# Patient Record
Sex: Male | Born: 1995 | Race: Black or African American | Hispanic: No | Marital: Single | Smoking: Current some day smoker
Health system: Southern US, Community
[De-identification: ages and names within clinical notes are randomized; demographics above are authoritative.]

## PROBLEM LIST (undated history)

## (undated) ENCOUNTER — Emergency Department (HOSPITAL_COMMUNITY): Admission: EM | Payer: BLUE CROSS/BLUE SHIELD | Source: Home / Self Care

## (undated) DIAGNOSIS — F1010 Alcohol abuse, uncomplicated: Secondary | ICD-10-CM

## (undated) DIAGNOSIS — K859 Acute pancreatitis without necrosis or infection, unspecified: Secondary | ICD-10-CM

---

## 2017-03-25 ENCOUNTER — Emergency Department (HOSPITAL_COMMUNITY)
Admission: EM | Admit: 2017-03-25 | Discharge: 2017-03-25 | Disposition: A | Discharge status: Home / Self Care | Payer: Self-pay | Attending: Emergency Medicine | Admitting: Emergency Medicine

## 2017-03-25 ENCOUNTER — Emergency Department (HOSPITAL_COMMUNITY): Payer: Self-pay

## 2017-03-25 ENCOUNTER — Encounter (HOSPITAL_COMMUNITY): Payer: Self-pay

## 2017-03-25 DIAGNOSIS — S93401A Sprain of unspecified ligament of right ankle, initial encounter: Secondary | ICD-10-CM | POA: Insufficient documentation

## 2017-03-25 DIAGNOSIS — Y998 Other external cause status: Secondary | ICD-10-CM | POA: Insufficient documentation

## 2017-03-25 DIAGNOSIS — Y9367 Activity, basketball: Secondary | ICD-10-CM | POA: Insufficient documentation

## 2017-03-25 DIAGNOSIS — F17200 Nicotine dependence, unspecified, uncomplicated: Secondary | ICD-10-CM | POA: Insufficient documentation

## 2017-03-25 DIAGNOSIS — Y929 Unspecified place or not applicable: Secondary | ICD-10-CM | POA: Insufficient documentation

## 2017-03-25 DIAGNOSIS — X501XXA Overexertion from prolonged static or awkward postures, initial encounter: Secondary | ICD-10-CM | POA: Insufficient documentation

## 2017-03-25 MED ORDER — IBUPROFEN 200 MG PO TABS
600.0000 mg | ORAL_TABLET | Freq: Once | ORAL | Status: AC
  Administered 2017-03-25: 600 mg via ORAL
  Filled 2017-03-25: qty 3

## 2017-03-25 NOTE — ED Provider Notes (Signed)
[facility]-EMERGENCY DEPT Provider Note   CSN: [id number] Arrival date & time: 03/25/17  0158     History   Chief Complaint Chief Complaint  Patient presents with  . Ankle Injury    HPI Steven Mckenzie is a 21 y.o. male.  HPI  21 year old male presents with right ankle pain. On 3/28 at around 2 PM he everted his ankle while playing basketball. The patient has not been able to bear weight since. He states it is too painful. He's been having bruising and swelling. Has taken ibuprofen. Denies any knee or calf pain. No weakness or numbness. No other injuries.  History reviewed. No pertinent past medical history.  There are no active problems to display for this patient.   History reviewed. No pertinent surgical history.     Home Medications    Prior to Admission medications   Not on File    Family History No family history on file.  Social History Social History  Substance Use Topics  . Smoking status: Current Some Day Smoker  . Smokeless tobacco: Never Used  . Alcohol use Yes     Comment: daily      Allergies   Banana; Celery oil; and Pineapple   Review of Systems Review of Systems  Musculoskeletal: Positive for arthralgias and joint swelling.  Skin: Positive for color change. Negative for wound.  Neurological: Negative for weakness and numbness.  All other systems reviewed and are negative.    Physical Exam Updated Vital Signs BP 131/83 (BP Location: Left Arm)   Pulse (!) 104   Temp 98.8 F (37.1 C) (Oral)   Resp 20   Ht  (1.803 m)   Wt 160 lb (72.6 kg)   SpO2 96%   BMI 22.32 kg/m   Physical Exam  Constitutional: He is oriented to person, place, and time. He appears well-developed and well-nourished. No distress.  HENT:  Head: Normocephalic and atraumatic.  Right Ear: External ear normal.  Left Ear: External ear normal.  Nose: Nose normal.  Eyes: Right eye exhibits no discharge. Left eye exhibits no discharge.  Neck: Neck supple.    Cardiovascular: Normal rate and regular rhythm.   Pulses:      Dorsalis pedis pulses are 2+ on the right side, and 2+ on the left side.  Pulmonary/Chest: Effort normal and breath sounds normal.  Abdominal: Soft. There is no tenderness.  Musculoskeletal: He exhibits no edema.       Right knee: No tenderness found.       Right ankle: He exhibits decreased range of motion, swelling and ecchymosis. He exhibits no deformity, no laceration and normal pulse. Tenderness. No proximal fibula tenderness found. Achilles tendon exhibits no pain and no defect.       Right lower leg: He exhibits no tenderness (specifically no fibular head tenderness).       Right foot: There is swelling (proximal). There is no tenderness.  Negative squeeze test Ecchymosis and swelling to ankle, distal lower leg, and proximal foot. Diffuse mild tenderness  Neurological: He is alert and oriented to person, place, and time.  Skin: Skin is warm and dry. He is not diaphoretic.  Nursing note and vitals reviewed.    ED Treatments / Results  Labs (all labs ordered are listed, but only abnormal results are displayed) Labs Reviewed - No data to display  EKG  EKG Interpretation None       Radiology Dg Ankle Complete Right  Result Date: 03/25/2017 CLINICAL DATA:  No evidence  of fracture or dislocation. Bruising and lateral swelling. Initial encounter. EXAM: RIGHT ANKLE - COMPLETE 3+ VIEW COMPARISON:  None. FINDINGS: There is no evidence of fracture or dislocation. The ankle mortise is intact; the interosseous space is within normal limits. No talar tilt or subluxation is seen. The joint spaces are preserved. Lateral soft tissue swelling is noted. IMPRESSION: No evidence of fracture or dislocation. Electronically Signed   By: Roanna Raider M.D.   On: 03/25/2017 02:51    Procedures Procedures (including critical care time)  Medications Ordered in ED Medications  ibuprofen (ADVIL,MOTRIN) tablet 600 mg (not  administered)     Initial Impression / Assessment and Plan / ED Course  I have reviewed the triage vital signs and the nursing notes.  Pertinent labs & imaging results that were available during my care of the patient were reviewed by me and considered in my medical decision making (see chart for details).     Patient's presentation is consistent with a high ankle sprain. Discussed using ice, ibuprofen, rest. Will place in a Cam Walker with crutches. X-rays unremarkable. Achilles exam is unremarkable and there is no proximal injuries noted. Overall well appearing otherwise. Neurovascularly intact. Will refer to orthopedics.  Final Clinical Impressions(s) / ED Diagnoses   Final diagnoses:  Sprain of right ankle, unspecified ligament, initial encounter    New Prescriptions New Prescriptions   No medications on file     Pricilla Loveless, MD 03/25/17 [phone]

## 2017-03-25 NOTE — ED Notes (Signed)
Bed: WA05 Expected date:  Expected time:  Means of arrival:  Comments: 

## 2017-03-25 NOTE — ED Triage Notes (Signed)
Pt presents with c/o right ankle injury. Pt reports that yesterday he was playing basketball and landed on someone else's foot. Pt does have some swelling to that ankle.

## 2017-05-06 ENCOUNTER — Encounter (HOSPITAL_COMMUNITY): Payer: Self-pay | Admitting: *Deleted

## 2017-05-06 ENCOUNTER — Ambulatory Visit (HOSPITAL_COMMUNITY)
Admission: EM | Admit: 2017-05-06 | Discharge: 2017-05-06 | Disposition: A | Discharge status: Home / Self Care | Payer: BLUE CROSS/BLUE SHIELD | Attending: Internal Medicine | Admitting: Internal Medicine

## 2017-05-06 DIAGNOSIS — J321 Chronic frontal sinusitis: Secondary | ICD-10-CM | POA: Diagnosis not present

## 2017-05-06 DIAGNOSIS — J3489 Other specified disorders of nose and nasal sinuses: Secondary | ICD-10-CM

## 2017-05-06 MED ORDER — METHYLPREDNISOLONE ACETATE 80 MG/ML IJ SUSP
INTRAMUSCULAR | Status: AC
  Filled 2017-05-06: qty 1

## 2017-05-06 MED ORDER — METHYLPREDNISOLONE ACETATE 80 MG/ML IJ SUSP
80.0000 mg | Freq: Once | INTRAMUSCULAR | Status: AC
  Administered 2017-05-06: 80 mg via INTRAMUSCULAR

## 2017-05-06 MED ORDER — AMOXICILLIN 500 MG PO CAPS: 500.0000 mg | ORAL_CAPSULE | Freq: Two times a day (BID) | ORAL | 0 refills | Status: DC

## 2017-05-06 NOTE — Discharge Instructions (Signed)
You have a sinus and left ear infection. Will treat with Amoxicillin and you are also getting a injected steroid today for inflammation. Stop the sudafed for now as it can make your congestion worse. Try Flonase nasal spray to help with congestion. It is over the counter. Drink fluids. Rest.

## 2017-05-06 NOTE — ED Triage Notes (Signed)
2   Weeks    Ago      Developed   Symptoms  Of  Cough   Congested            Sneezing    And   Nasal  Pressure   Pressure  And  Headaches    Also   Wants   To  Be   Checked     For  Std  But  Has  No  Symptoms

## 2017-05-06 NOTE — ED Provider Notes (Signed)
CSN: [id number]     Arrival date & time 05/06/17  1649 History   First MD Initiated Contact with Patient 05/06/17 1736     Chief Complaint  Patient presents with  . URI   (Consider location/radiation/quality/duration/timing/severity/associated sxs/prior Treatment)  Patient presents with a 2 weeks history of nasal congestion, frontal headaches, fatigue, and ear aches. No fevers, some chills. No cough      History reviewed. No pertinent past medical history. History reviewed. No pertinent surgical history. History reviewed. No pertinent family history. Social History  Substance Use Topics  . Smoking status: Current Some Day Smoker  . Smokeless tobacco: Never Used  . Alcohol use Yes     Comment: daily     Review of Systems  Constitutional: Positive for chills. Negative for fever.  HENT: Positive for congestion, sinus pain and sinus pressure.   Eyes: Negative.   Respiratory: Negative for cough and shortness of breath.   Allergic/Immunologic: Positive for environmental allergies.  Psychiatric/Behavioral: Negative.     Allergies  Banana; Celery oil; and Pineapple  Home Medications   Prior to Admission medications   Medication Sig Start Date End Date Taking? Authorizing Provider  Pseudoephedrine HCl (SUDAFED PO) Take by mouth.   Yes [provider]  amoxicillin (AMOXIL) 500 MG capsule Take 1 capsule (500 mg total) by mouth 2 (two) times daily. 05/06/17   Riki SheerYoung, Meribeth Vitug G, PA-C   Meds Ordered and Administered this Visit   Medications  methylPREDNISolone acetate (DEPO-MEDROL) injection 80 mg (not administered)    BP 113/66 (BP Location: Right Arm)   Pulse 98   Temp 98.6 F (37 C) (Oral)   Resp 18   SpO2 99%  No data found.   Physical Exam  Constitutional: He is oriented to person, place, and time. He appears well-developed and well-nourished. No distress.  HENT:  Head: Normocephalic and atraumatic.  Right Ear: External ear normal.  Mouth/Throat:  Oropharynx is clear and moist.  Left ear with retracted TM and mild effusion, nasal turbinates with erythema and swelling  Neck: Normal range of motion.  Pulmonary/Chest: Effort normal and breath sounds normal.  Lymphadenopathy:    He has no cervical adenopathy.  Neurological: He is alert and oriented to person, place, and time.  Skin: Skin is warm and dry. He is not diaphoretic.  Psychiatric: His behavior is normal.  Nursing note and vitals reviewed.   Urgent Care Course     Procedures (including critical care time)  Labs Review Labs Reviewed - No data to display  Imaging Review No results found.   Visual Acuity Review  Right Eye Distance:   Left Eye Distance:   Bilateral Distance:    Right Eye Near:   Left Eye Near:    Bilateral Near:         MDM   1. Sinusitis chronic, frontal   2. Sinus pressure    Typical sinusitis with ear effusion. Treat with IM Depomedrol and Amoxicillin. Suggest use of OTC nasal steroid spray. Fluids and rest. F/u as needed.     Riki SheerYoung, Jobina Maita G, [place] 05/06/17 1811

## 2017-05-07 ENCOUNTER — Emergency Department (HOSPITAL_COMMUNITY): Payer: BLUE CROSS/BLUE SHIELD

## 2017-05-07 ENCOUNTER — Emergency Department (HOSPITAL_COMMUNITY)
Admission: EM | Admit: 2017-05-07 | Discharge: 2017-05-07 | Discharge status: Against Medical Advice | Payer: BLUE CROSS/BLUE SHIELD | Attending: Emergency Medicine | Admitting: Emergency Medicine

## 2017-05-07 DIAGNOSIS — S60512A Abrasion of left hand, initial encounter: Secondary | ICD-10-CM | POA: Diagnosis not present

## 2017-05-07 DIAGNOSIS — S60511A Abrasion of right hand, initial encounter: Secondary | ICD-10-CM | POA: Diagnosis not present

## 2017-05-07 DIAGNOSIS — Y939 Activity, unspecified: Secondary | ICD-10-CM | POA: Insufficient documentation

## 2017-05-07 DIAGNOSIS — F10920 Alcohol use, unspecified with intoxication, uncomplicated: Secondary | ICD-10-CM

## 2017-05-07 DIAGNOSIS — X58XXXA Exposure to other specified factors, initial encounter: Secondary | ICD-10-CM | POA: Insufficient documentation

## 2017-05-07 DIAGNOSIS — F17200 Nicotine dependence, unspecified, uncomplicated: Secondary | ICD-10-CM | POA: Diagnosis not present

## 2017-05-07 DIAGNOSIS — Y929 Unspecified place or not applicable: Secondary | ICD-10-CM | POA: Diagnosis not present

## 2017-05-07 DIAGNOSIS — Y999 Unspecified external cause status: Secondary | ICD-10-CM | POA: Diagnosis not present

## 2017-05-07 DIAGNOSIS — Z79899 Other long term (current) drug therapy: Secondary | ICD-10-CM | POA: Diagnosis not present

## 2017-05-07 DIAGNOSIS — F10120 Alcohol abuse with intoxication, uncomplicated: Secondary | ICD-10-CM | POA: Diagnosis not present

## 2017-05-07 DIAGNOSIS — S6992XA Unspecified injury of left wrist, hand and finger(s), initial encounter: Secondary | ICD-10-CM

## 2017-05-07 DIAGNOSIS — S61217A Laceration without foreign body of left little finger without damage to nail, initial encounter: Secondary | ICD-10-CM | POA: Diagnosis not present

## 2017-05-07 DIAGNOSIS — T07XXXA Unspecified multiple injuries, initial encounter: Secondary | ICD-10-CM

## 2017-05-07 NOTE — Discharge Instructions (Signed)
Keep wound clean with mild soap and water. Keep area covered with a topical antibiotic ointment and bandage, keep bandage dry. Ice and elevate for additional pain and swelling relief. Alternate between Ibuprofen and Tylenol for additional pain relief. STOP DRINKING! Follow up with your primary care doctor or [facility] in approximately 7 days for wound recheck. Monitor area for signs of infection to include, but not limited to: increasing pain, spreading redness, drainage/pus, worsening swelling, or fevers. Return to emergency department for emergent changing or worsening symptoms.

## 2017-05-07 NOTE — ED Triage Notes (Signed)
BIB Friend, pt grossly intoxicated reporting left hand pain/injury.

## 2017-05-07 NOTE — ED Provider Notes (Signed)
[facility]-EMERGENCY DEPT Provider Note   CSN: [id number] Arrival date & time: 05/07/17  0135     History   Chief Complaint Chief Complaint  Patient presents with  . Alcohol Intoxication  . Hand Injury    HPI Steven Mckenzie is a 21 y.o. male with no known PMHx, who presents to the ED brought in by his friends, appearing intoxicated. Level 5 caveat due to intoxication. Patient sleeping on initial exam and does not give any additional information. Appears heavily intoxicated and smells of alcohol. Triage note states that he reported L hand pain/injury to them, but he does not answer questions or wake up for evaluation/history taking so no further information can be obtained regarding this. Unable to get any information therefore history of present illness and ROS are severely limited secondary to intoxication.   The history is provided by the patient, medical records and a friend. The history is limited by the condition of the patient. No language interpreter was used.  Alcohol Intoxication  This is a new problem. The current episode started less than 1 hour ago. The problem occurs constantly. The problem has not changed since onset.The symptoms are aggravated by drinking. Nothing relieves the symptoms. He has tried nothing for the symptoms. The treatment provided no relief.  Hand Injury      No past medical history on file.  There are no active problems to display for this patient.   No past surgical history on file.     Home Medications    Prior to Admission medications   Medication Sig Start Date End Date Taking? Authorizing Provider  amoxicillin (AMOXIL) 500 MG capsule Take 1 capsule (500 mg total) by mouth 2 (two) times daily. 05/06/17   Riki Sheer, PA-C  Pseudoephedrine HCl (SUDAFED PO) Take by mouth.    [provider]    Family History No family history on file.  Social History Social History  Substance Use Topics  . Smoking status: Current Some Day  Smoker  . Smokeless tobacco: Never Used  . Alcohol use Yes     Comment: daily      Allergies   Banana; Celery oil; and Pineapple   Review of Systems Review of Systems  Unable to perform ROS: Other   Level 5 caveat due to intoxication  Physical Exam Updated Vital Signs BP (!) 122/93 (BP Location: Left Arm)   Pulse 67   Temp 98.3 F (36.8 C) (Oral)   Resp 18   SpO2 99%   Physical Exam  Constitutional: Vital signs are normal. He appears well-developed and well-nourished. He is uncooperative.  Non-toxic appearance. No distress.  Afebrile, nontoxic, NAD but sleeping soundly, smells of alcohol, withdraws from painful stimuli but won't awaken or respond verbally; protecting airway  HENT:  Head: Normocephalic and atraumatic.  Mouth/Throat: Mucous membranes are normal.  Eyes: Conjunctivae and EOM are normal. Right eye exhibits no discharge. Left eye exhibits no discharge.  Neck: Normal range of motion. Neck supple.  Cardiovascular: Normal rate, regular rhythm, S1 normal, S2 normal, normal heart sounds and intact distal pulses.  Exam reveals no gallop and no friction rub.   No murmur heard. Pulmonary/Chest: Effort normal and breath sounds normal. No respiratory distress. He has no decreased breath sounds. He has no wheezes. He has no rhonchi. He has no rales.  Abdominal: Soft. Normal appearance and bowel sounds are normal. He exhibits no distension. There is no tenderness. There is no rigidity, no rebound and no guarding.  Musculoskeletal: Normal  range of motion.  Moves all extremities in response to painful stimuli, but won't cooperate with exam on initial eval.  R hand with abrasions to knuckles of 2nd-3rd digits L hand with abrasions and small superficial laceration to 5th metacarpal region, no active bleeding  Later once awake, pt reports sensation is intact in the L hand, but refuses to grip or perform remainder of MsK exam, distal pulses intact. Pt requesting to leave.    Neurological: GCS motor subscore is 5.  Pt intoxicated and sleeping deeply on initial exam, withdraws from pain but won't open his eyes or respond verbally; unable to assess neuro exam further; pt is maintaining his airway and does not appear to need urgent ventilatory support  Skin: Skin is warm and dry. Abrasion noted. No rash noted.  Hand abrasions as mentioned above  Psychiatric: He has a normal mood and affect.  Nursing note and vitals reviewed.    ED Treatments / Results  Labs (all labs ordered are listed, but only abnormal results are displayed) Labs Reviewed - No data to display  EKG  EKG Interpretation None       Radiology Dg Hand Complete Left  Result Date: 05/07/2017 CLINICAL DATA:  Acute onset of left hand injury, with pain. Initial encounter. EXAM: LEFT HAND - COMPLETE 3+ VIEW COMPARISON:  None. FINDINGS: There is no evidence of fracture or dislocation. The joint spaces are preserved. The carpal rows are intact, and demonstrate normal alignment. The soft tissues are unremarkable in appearance. IMPRESSION: No evidence of fracture or dislocation. Electronically Signed   By: Roanna RaiderJeffery  Chang M.D.   On: 05/07/2017 02:28    Procedures Procedures (including critical care time)  Medications Ordered in ED Medications - No data to display   Initial Impression / Assessment and Plan / ED Course  I have reviewed the triage vital signs and the nursing notes.  Pertinent labs & imaging results that were available during my care of the patient were reviewed by me and considered in my medical decision making (see chart for details).     21 y.o. male here for alcohol intoxication. Upon initial eval at ~3:40am, pt sleeping soundly, unable to be awakened, withdrawals from pain but doesn't open his eyes to respond. Smells strongly of alcohol. Cannot get any information at this time. Only notable findings on physical exam are abrasions to L 5th finger knuckles and R 2nd-3rd finger  knuckles, but can't get great exam due to pt being asleep/intoxicated. Protecting his airway and does not appear to need urgent ventilatory support. Will allow for pt to sober up and reassess. Hold off on any labs for now. L hand xray obtained in triage which is neg for injury. Will reassess shortly  6:54 AM Pt awake, at desk, demanding to use phone. Cursing and stating he has a car to get to and that he wasn't evaluated. Advised that we attempted eval earlier and he was intoxicated and uncooperative. I asked if he wanted to be evaluated and he said yes but then he refused to answer any questions and continued to be agitated and requesting to leave. He reported he had sensation to light touch of his hand but he refused to let me continue my exam. Advised that his xray was neg and he is ambulatory and no longer intoxicated so he can chose to end his ED visit now since there are no further emergent issues needing attention. Pt angry and states he wants to leave and declines further evaluation of  his hand. Advised to care for his abrasions and f/up with PCP. I explained the diagnosis and have given explicit precautions to return to the ER including for any other new or worsening symptoms. I have answered their questions. Discharge instructions concerning home care and prescriptions have been written but pt eloped before they were given. The patient is STABLE and is discharged to home in good condition.    Final Clinical Impressions(s) / ED Diagnoses   Final diagnoses:  Alcoholic intoxication without complication (HCC)  Abrasions of multiple sites  Injury of left hand, initial encounter    New Prescriptions New Prescriptions   No medications on [address], [place], [place] 05/07/17 1610    Gilda Crease, MD 05/08/17 0230

## 2017-05-17 ENCOUNTER — Inpatient Hospital Stay (HOSPITAL_COMMUNITY)
Admission: EM | Admit: 2017-05-17 | Discharge: 2017-05-20 | DRG: 439 | Disposition: A | Discharge status: Home / Self Care | Payer: BLUE CROSS/BLUE SHIELD | Attending: Internal Medicine | Admitting: Internal Medicine

## 2017-05-17 ENCOUNTER — Encounter (HOSPITAL_COMMUNITY): Payer: Self-pay

## 2017-05-17 ENCOUNTER — Inpatient Hospital Stay (HOSPITAL_COMMUNITY): Payer: BLUE CROSS/BLUE SHIELD

## 2017-05-17 DIAGNOSIS — F17210 Nicotine dependence, cigarettes, uncomplicated: Secondary | ICD-10-CM | POA: Diagnosis present

## 2017-05-17 DIAGNOSIS — Z720 Tobacco use: Secondary | ICD-10-CM

## 2017-05-17 DIAGNOSIS — K852 Alcohol induced acute pancreatitis without necrosis or infection: Secondary | ICD-10-CM | POA: Diagnosis present

## 2017-05-17 DIAGNOSIS — F1210 Cannabis abuse, uncomplicated: Secondary | ICD-10-CM | POA: Diagnosis present

## 2017-05-17 DIAGNOSIS — E871 Hypo-osmolality and hyponatremia: Secondary | ICD-10-CM | POA: Diagnosis present

## 2017-05-17 DIAGNOSIS — K859 Acute pancreatitis without necrosis or infection, unspecified: Secondary | ICD-10-CM

## 2017-05-17 DIAGNOSIS — E876 Hypokalemia: Secondary | ICD-10-CM | POA: Diagnosis present

## 2017-05-17 LAB — COMPREHENSIVE METABOLIC PANEL
ALT: 28 U/L (ref 17–63)
AST: 45 U/L — AB (ref 15–41)
Albumin: 4.3 g/dL (ref 3.5–5.0)
Alkaline Phosphatase: 69 U/L (ref 38–126)
Anion gap: 13 (ref 5–15)
BILIRUBIN TOTAL: 0.6 mg/dL (ref 0.3–1.2)
BUN: 14 mg/dL (ref 6–20)
CO2: 21 mmol/L — ABNORMAL LOW (ref 22–32)
Calcium: 8.8 mg/dL — ABNORMAL LOW (ref 8.9–10.3)
Chloride: 104 mmol/L (ref 101–111)
Creatinine, Ser: 0.9 mg/dL (ref 0.61–1.24)
Glucose, Bld: 149 mg/dL — ABNORMAL HIGH (ref 65–99)
POTASSIUM: 3.4 mmol/L — AB (ref 3.5–5.1)
Sodium: 138 mmol/L (ref 135–145)
TOTAL PROTEIN: 7.8 g/dL (ref 6.5–8.1)

## 2017-05-17 LAB — CBC
HCT: 44.1 % (ref 39.0–52.0)
HCT: 43.8 % (ref 39.0–52.0)
Hemoglobin: 15.4 g/dL (ref 13.0–17.0)
Hemoglobin: 14.9 g/dL (ref 13.0–17.0)
MCH: 33.6 pg (ref 26.0–34.0)
MCH: 33.1 pg (ref 26.0–34.0)
MCHC: 34.9 g/dL (ref 30.0–36.0)
MCHC: 34 g/dL (ref 30.0–36.0)
MCV: 96.3 fL (ref 78.0–100.0)
MCV: 97.3 fL (ref 78.0–100.0)
PLATELETS: 369 10*3/uL (ref 150–400)
Platelets: 329 10*3/uL (ref 150–400)
RBC: 4.58 MIL/uL (ref 4.22–5.81)
RBC: 4.5 MIL/uL (ref 4.22–5.81)
RDW: 13.9 % (ref 11.5–15.5)
RDW: 14.1 % (ref 11.5–15.5)
WBC: 12.6 10*3/uL — AB (ref 4.0–10.5)
WBC: 16.9 10*3/uL — ABNORMAL HIGH (ref 4.0–10.5)

## 2017-05-17 LAB — URINALYSIS, ROUTINE W REFLEX MICROSCOPIC
Bilirubin Urine: NEGATIVE
Glucose, UA: NEGATIVE mg/dL
Hgb urine dipstick: NEGATIVE
KETONES UR: 5 mg/dL — AB
LEUKOCYTES UA: NEGATIVE
Nitrite: NEGATIVE
Protein, ur: NEGATIVE mg/dL
Specific Gravity, Urine: 1.016 (ref 1.005–1.030)
pH: 5 (ref 5.0–8.0)

## 2017-05-17 LAB — RAPID URINE DRUG SCREEN, HOSP PERFORMED
AMPHETAMINES: NOT DETECTED
BENZODIAZEPINES: NOT DETECTED
Barbiturates: NOT DETECTED
Cocaine: NOT DETECTED
Opiates: POSITIVE — AB
TETRAHYDROCANNABINOL: POSITIVE — AB

## 2017-05-17 LAB — TRIGLYCERIDES: TRIGLYCERIDES: 50 mg/dL (ref <150)

## 2017-05-17 LAB — CREATININE, SERUM
Creatinine, Ser: 0.72 mg/dL (ref 0.61–1.24)
GFR calc Af Amer: >60 mL/min (ref >60)
GFR calc non Af Amer: >60 mL/min (ref >60)

## 2017-05-17 LAB — LIPASE, BLOOD: Lipase: 560 U/L — ABNORMAL HIGH (ref 11–51)

## 2017-05-17 LAB — ETHANOL: Alcohol, Ethyl (B): <5 mg/dL (ref <5)

## 2017-05-17 MED ORDER — ONDANSETRON HCL 4 MG/2ML IJ SOLN
4.0000 mg | Freq: Four times a day (QID) | INTRAMUSCULAR | Status: DC | PRN
  Administered 2017-05-17 (×2): 4 mg via INTRAVENOUS
  Filled 2017-05-17 (×3): qty 2

## 2017-05-17 MED ORDER — ONDANSETRON HCL 4 MG/2ML IJ SOLN
4.0000 mg | Freq: Once | INTRAMUSCULAR | Status: AC
  Administered 2017-05-17: 4 mg via INTRAVENOUS
  Filled 2017-05-17: qty 2

## 2017-05-17 MED ORDER — ENOXAPARIN SODIUM 40 MG/0.4ML ~~LOC~~ SOLN
40.0000 mg | SUBCUTANEOUS | Status: DC
  Administered 2017-05-17 – 2017-05-19 (×3): 40 mg via SUBCUTANEOUS
  Filled 2017-05-17 (×3): qty 0.4

## 2017-05-17 MED ORDER — SODIUM CHLORIDE 0.9 % IV SOLN
INTRAVENOUS | Status: DC
  Administered 2017-05-17 – 2017-05-20 (×10): via INTRAVENOUS

## 2017-05-17 MED ORDER — SODIUM CHLORIDE 0.9 % IV BOLUS (SEPSIS)
1000.0000 mL | Freq: Once | INTRAVENOUS | Status: AC
  Administered 2017-05-17: 1000 mL via INTRAVENOUS

## 2017-05-17 MED ORDER — ONDANSETRON 4 MG PO TBDP
4.0000 mg | ORAL_TABLET | Freq: Once | ORAL | Status: AC | PRN
  Administered 2017-05-17: 4 mg via ORAL
  Filled 2017-05-17: qty 1

## 2017-05-17 MED ORDER — ONDANSETRON HCL 4 MG PO TABS: 4.0000 mg | ORAL_TABLET | Freq: Four times a day (QID) | ORAL | Status: DC | PRN

## 2017-05-17 MED ORDER — ALBUTEROL SULFATE (2.5 MG/3ML) 0.083% IN NEBU: 2.5000 mg | INHALATION_SOLUTION | RESPIRATORY_TRACT | Status: DC | PRN

## 2017-05-17 MED ORDER — HYDROMORPHONE HCL 1 MG/ML IJ SOLN
1.0000 mg | Freq: Once | INTRAMUSCULAR | Status: AC
Start: 2017-05-17 — End: 2017-05-17
  Administered 2017-05-17: 1 mg via INTRAVENOUS
  Filled 2017-05-17: qty 1

## 2017-05-17 MED ORDER — HYDROMORPHONE HCL 1 MG/ML IJ SOLN
1.0000 mg | Freq: Once | INTRAMUSCULAR | Status: AC
  Administered 2017-05-17: 1 mg via INTRAVENOUS
  Filled 2017-05-17: qty 1

## 2017-05-17 MED ORDER — SODIUM CHLORIDE 0.9 % IV SOLN: 30.0000 meq | Freq: Once | INTRAVENOUS | Status: DC

## 2017-05-17 MED ORDER — HYDROMORPHONE HCL 1 MG/ML IJ SOLN
1.0000 mg | INTRAMUSCULAR | Status: DC | PRN
  Administered 2017-05-17 – 2017-05-19 (×7): 1 mg via INTRAVENOUS
  Filled 2017-05-17 (×8): qty 1

## 2017-05-17 MED ORDER — ACETAMINOPHEN 325 MG PO TABS
650.0000 mg | ORAL_TABLET | Freq: Four times a day (QID) | ORAL | Status: DC | PRN
  Administered 2017-05-18: 650 mg via ORAL
  Filled 2017-05-17: qty 2

## 2017-05-17 MED ORDER — POTASSIUM CHLORIDE 10 MEQ/100ML IV SOLN
10.0000 meq | INTRAVENOUS | Status: AC
  Administered 2017-05-17 (×3): 10 meq via INTRAVENOUS
  Filled 2017-05-17 (×3): qty 100

## 2017-05-17 MED ORDER — ACETAMINOPHEN 650 MG RE SUPP: 650.0000 mg | Freq: Four times a day (QID) | RECTAL | Status: DC | PRN

## 2017-05-17 MED ORDER — PANTOPRAZOLE SODIUM 40 MG IV SOLR
40.0000 mg | INTRAVENOUS | Status: DC
  Administered 2017-05-17 – 2017-05-19 (×3): 40 mg via INTRAVENOUS
  Filled 2017-05-17 (×3): qty 40

## 2017-05-17 MED ORDER — OXYCODONE HCL 5 MG PO TABS
5.0000 mg | ORAL_TABLET | ORAL | Status: DC | PRN
  Administered 2017-05-18 – 2017-05-20 (×10): 5 mg via ORAL
  Filled 2017-05-17 (×10): qty 1

## 2017-05-17 NOTE — Progress Notes (Signed)
Pt still having pain on left side and vomiting though not as often as when he first arrived on the floor.  Will continue to monitor.

## 2017-05-17 NOTE — ED Provider Notes (Signed)
[facility] see provider note for full history and physical. Briefly, patient is a 21 year old male with a history of pancreatitis which required hospitalization and a history of alcohol abuse with chief complaint acute onset epigastric and left upper quadrant pain which began when he awoke this morning. He states these are similar to previous episodes of pancreatitis. He complains of severe pain, nausea and vomiting has improved. He is afebrile. Lipase is 560. Pain and nausea is being managed while in the ED. Will PO challenge. Patient is able to tolerate PO and pain is controlled he is stable for discharge home. If pain and vomiting persist, he may require admission.  Physical Exam  BP 128/75 (BP Location: Right Arm)   Pulse 84   Temp 97.8 F (36.6 C) (Oral)   Resp 18   SpO2 95%   Physical Exam  Constitutional: He appears well-developed and well-nourished. No distress.  HENT:  Head: Normocephalic and atraumatic.  Eyes: Conjunctivae are normal. Right eye exhibits no discharge. Left eye exhibits no discharge.  Neck: No JVD present. No tracheal deviation present.  Cardiovascular: Normal rate, regular rhythm and normal heart sounds.   Pulmonary/Chest: Effort normal and breath sounds normal.  Abdominal: Bowel sounds are normal. He exhibits no distension and no mass. There is tenderness. There is guarding.  Generalized TTP, maximally in the epigastric region. Murphy's absent  Neurological: He is alert.  Skin: Skin is warm and dry. He is not diaphoretic.  Psychiatric: He has a normal mood and affect. His behavior is normal.    ED Course  Procedures  MDM Patient's pain remains severe even with multiple doses of Dilaudid with only temporary relief. Patient initially tolerated PO fluid but vomited. Hospitalist service was consulted, assumed care, and he will be brought in to the hospital for management of his pancreatitis.       Bennye AlmFawze, Marcial Pless A, PA-C 05/17/17 [id number]     Palumbo, April, MD 05/19/17 2302

## 2017-05-17 NOTE — ED Provider Notes (Signed)
[facility]-EMERGENCY DEPT Provider Note   CSN: [id number] Arrival date & time: 05/17/17  0200     History   Chief Complaint Chief Complaint  Patient presents with  . Abdominal Pain    HPI Selmer Adduci is a 21 y.o. male who presents with abdominal pain, N/V. PMHx significant for pancreatitis and ETOH abuse. He states that he woke up with an acute onset of epigastric/left sided abdominal pain early this morning. The pain is severe, sharp, stabbing. He states this feels like his prior pancreatitis attacks. He notes that he has had to be admitted for this in the past. He has associated nausea and non-bloody emesis. He endorses heavy alcohol use and knows he needs to stop drinking. No fever, chills, chest pain, SOB, diarrhea, dysuria, testicular pain.  HPI  No past medical history on file.  There are no active problems to display for this patient.   No past surgical history on file.     Home Medications    Prior to Admission medications   Medication Sig Start Date End Date Taking? Authorizing Provider  amoxicillin (AMOXIL) 500 MG capsule Take 1 capsule (500 mg total) by mouth 2 (two) times daily. Patient not taking: Reported on 05/17/2017 05/06/17   Riki Sheer, PA-C    Family History No family history on file.  Social History Social History  Substance Use Topics  . Smoking status: Current Some Day Smoker  . Smokeless tobacco: Never Used  . Alcohol use Yes     Comment: daily      Allergies   Banana; Celery oil; and Pineapple   Review of Systems Review of Systems  Constitutional: Negative for chills and fever.  Respiratory: Negative for shortness of breath.   Cardiovascular: Negative for chest pain.  Gastrointestinal: Positive for abdominal pain, nausea and vomiting. Negative for diarrhea.  Genitourinary: Negative for dysuria and testicular pain.  All other systems reviewed and are negative.    Physical Exam Updated Vital Signs BP 120/71 (BP Location:  Right Arm)   Pulse 85   Temp 97.8 F (36.6 C) (Oral)   Resp 16   SpO2 98%   Physical Exam  Constitutional: He is oriented to person, place, and time. He appears well-developed and well-nourished. He appears distressed.  In fetal position  HENT:  Head: Normocephalic and atraumatic.  Eyes: Conjunctivae are normal. Pupils are equal, round, and reactive to light. Right eye exhibits no discharge. Left eye exhibits no discharge. No scleral icterus.  Neck: Normal range of motion.  Cardiovascular: Normal rate and regular rhythm.  Exam reveals no gallop and no friction rub.   No murmur heard. Pulmonary/Chest: Effort normal and breath sounds normal. No respiratory distress. He has no wheezes. He has no rales. He exhibits no tenderness.  Abdominal: Soft. Bowel sounds are normal. He exhibits no distension and no mass. There is tenderness (generalized tenderness but most tender in epigastric and LUQ). There is guarding (voluntary). There is no rebound. No hernia.  Neurological: He is alert and oriented to person, place, and time.  Skin: Skin is warm and dry.  Psychiatric: He has a normal mood and affect. His behavior is normal.  Nursing note and vitals reviewed.    ED Treatments / Results  Labs (all labs ordered are listed, but only abnormal results are displayed) Labs Reviewed  LIPASE, BLOOD - Abnormal; Notable for the following:       Result Value   Lipase 560 (*)    All other components within normal  limits  COMPREHENSIVE METABOLIC PANEL - Abnormal; Notable for the following:    Potassium 3.4 (*)    CO2 21 (*)    Glucose, Bld 149 (*)    Calcium 8.8 (*)    AST 45 (*)    All other components within normal limits  CBC - Abnormal; Notable for the following:    WBC 12.6 (*)    All other components within normal limits  URINALYSIS, ROUTINE W REFLEX MICROSCOPIC - Abnormal; Notable for the following:    Ketones, ur 5 (*)    All other components within normal limits    EKG  EKG  Interpretation None       Radiology No results found.  Procedures Procedures (including critical care time)  Medications Ordered in ED Medications  ondansetron (ZOFRAN-ODT) disintegrating tablet 4 mg (4 mg Oral Given 05/17/17 0210)  sodium chloride 0.9 % bolus 1,000 mL (0 mLs Intravenous Stopped 05/17/17 0642)  HYDROmorphone (DILAUDID) injection 1 mg (1 mg Intravenous Given 05/17/17 0558)  ondansetron (ZOFRAN) injection 4 mg (4 mg Intravenous Given 05/17/17 0558)  sodium chloride 0.9 % bolus 1,000 mL (0 mLs Intravenous Stopped 05/17/17 0715)  HYDROmorphone (DILAUDID) injection 1 mg (1 mg Intravenous Given 05/17/17 0642)  HYDROmorphone (DILAUDID) injection 1 mg (1 mg Intravenous Given 05/17/17 0729)     Initial Impression / Assessment and Plan / ED Course  I have reviewed the triage vital signs and the nursing notes.  Pertinent labs & imaging results that were available during my care of the patient were reviewed by me and considered in my medical decision making (see chart for details).  21 year old with pancreatitis. Vitals are normal. Pt is distressed on exam and after pain medicine he has epigastric and LUQ tenderness. CBC remarkable for leukocytosis of 12.6. CMP remarkable for mild hypokalemia and mild hyperglycemia. AST is 45. Lipase is 560. UA has 5 ketones. Will start fluids, pain meds, antiemetics. Pt signed out to Ardine EngM Fawze PA-C at shift change. Anticipate d/c if pain is controlled and he tolerates PO.  Final Clinical Impressions(s) / ED Diagnoses   Final diagnoses:  Alcohol-induced acute pancreatitis, unspecified complication status    New Prescriptions New Prescriptions   No medications on file     Beryle QuantGekas, Tanvi Gatling Marie, PA-C 05/17/17 [id number]    Palumbo, April, MD 05/17/17 [id number]

## 2017-05-17 NOTE — ED Notes (Signed)
Bed: WA24 Expected date:  Expected time:  Means of arrival:  Comments: 

## 2017-05-17 NOTE — ED Triage Notes (Signed)
Pt reports 10/10 abd pain w/ n/v. Pt reports hx of Pancreatitis. Pt reports drinking an undisclosed amount of alcohol today.

## 2017-05-17 NOTE — H&P (Signed)
HISTORY AND PHYSICAL       PATIENT DETAILS Name: Steven Mckenzie Age: 21 y.o. Sex: male Date of Birth: [birth year] Admit Date: 05/17/2017 ZOX:[id number]:Patient, No Pcp Per   Patient coming from: Home   CHIEF COMPLAINT:  Abdominal pain since last night  HPI: Steven Mckenzie is a 21 y.o. male with medical history significant of prior history of pancreatitis who is currently a Consulting civil engineerstudent at [employer] brought to the ED for evaluation of worsening abdominal pain that started last night. Per patient, he has been binge drinking alcohol for the past 2 days, last night he started having upper abdominal discomfort that has gradually worsened. He describes the pain as dull in nature, 10/10 at its worse without any radiation. There is particular aggravating factors, pain is partially controlled with IV narcotics in the emergency room. Patient has been associated with numerous episodes of nonbloody and nonbilious vomiting. He was seen in the emergency room and found to have a lipase of more than 500, he was given IV fluids, IV narcotics and monitored closely, but he continues to have vomiting and abdominal pain, as a result the hospitalist service was asked to admit this patient for further evaluation and treatment  As noted above-patient has been binge drinking for the past 2 days-he is very vague and does not give a good history regarding this. He does claim that he [place] pancreat[place]-he attributes that pancreatitis was secondary to trauma.  He denies any fever, chest pain, shortness of breath, diarrhea.  ED Course:  See above  Note: Lives at: Home Mobility: Independent Chronic Indwelling Foley:no   REVIEW OF SYSTEMS:  Constitutional:   No  weight loss, night sweats,  Fevers, chills, fatigue.  HEENT:    No headaches, Dysphagia,Tooth/dental problems,Sore throat,  No sneezing, itching, ear ache, nasal congestion, post nasal  drip  Cardio-vascular: No chest pain,Orthopnea, PND,lower extremity edema, anasarca, palpitations  GI:  No heartburn,  diarrhea, melena or hematochezia  Resp: No shortness of breath, cough, hemoptysis,plueritic chest pain.   Skin:  No rash or lesions.  GU:  No dysuria, change in color of urine, no urgency or frequency.  No flank pain.  Musculoskeletal: No joint pain or swelling.  No decreased range of motion.  No back pain.  Endocrine: No heat intolerance, no cold intolerance, no polyuria, no polydipsia  Psych: No change in mood or affect. No depression or anxiety.  No memory loss.   ALLERGIES:   Allergies  Allergen Reactions  . Banana   . Celery Oil     Celery   . Pineapple     PAST MEDICAL HISTORY: Pancreatitis  PAST SURGICAL HISTORY: No past surgical history on file.  MEDICATIONS AT HOME: Prior to Admission medications   Medication Sig Start Date End Date Taking? Authorizing Provider  amoxicillin (AMOXIL) 500 MG capsule Take 1 capsule (500 mg total) by mouth 2 (two) times daily. Patient not taking: Reported on 05/17/2017 05/06/17   Riki SheerYoung, Michelle G, PA-C    FAMILY HISTORY: Denies any family history of pancreatic disease and pancreatic cancer. No family history of CAD  SOCIAL HISTORY:  reports that he has been smoking.  He has never used smokeless tobacco. He reports that he drinks alcohol. He reports that he uses drugs, including Marijuana.  PHYSICAL EXAM: Blood pressure 114/65, pulse 83, temperature 98.3 F (36.8 C), temperature source Oral, resp. rate 14, SpO2 97 %.  General appearance :  Awake, alert, not in any distress. Speech Clear. Not toxic Looking Eyes:, pupils equally reactive to light and accomodation,no scleral icterus.Pink conjunctiva HEENT: Atraumatic and Normocephalic Neck: supple, no JVD. No cervical lymphadenopathy. No thyromegaly Resp:Good air entry bilaterally, no added sounds  CVS: S1 S2 regular, no murmurs.  GI: Bowel sounds  present, moderately tender in the epigastric and periumbilical area, without any peritoneal signs.  Extremities: B/L Lower Ext shows no edema, both legs are warm to touch Neurology:  speech clear,Non focal, sensation is grossly intact. Psychiatric: Normal judgment and insight. Alert and oriented x 3. Normal mood. Musculoskeletal:gait appears to be normal.No digital cyanosis Skin:No Rash, warm and dry Wounds:N/A  LABS ON ADMISSION:  I have personally reviewed following labs and imaging studies  CBC:  Recent Labs Lab 05/17/17 0207  WBC 12.6*  HGB 15.4  HCT 44.1  MCV 96.3  PLT 369    Basic Metabolic Panel:  Recent Labs Lab 05/17/17 0207  NA 138  K 3.4*  CL 104  CO2 21*  GLUCOSE 149*  BUN 14  CREATININE 0.90  CALCIUM 8.8*    GFR: CrCl cannot be calculated (Unknown ideal weight.).  Liver Function Tests:  Recent Labs Lab 05/17/17 0207  AST 45*  ALT 28  ALKPHOS 69  BILITOT 0.6  PROT 7.8  ALBUMIN 4.3    Recent Labs Lab 05/17/17 0207  LIPASE 560*   No results for input(s): AMMONIA in the last 168 hours.  Coagulation Profile: No results for input(s): INR, PROTIME in the last 168 hours.  Cardiac Enzymes: No results for input(s): CKTOTAL, CKMB, CKMBINDEX, TROPONINI in the last 168 hours.  BNP (last 3 results) No results for input(s): PROBNP in the last 8760 hours.  HbA1C: No results for input(s): HGBA1C in the last 72 hours.  CBG: No results for input(s): GLUCAP in the last 168 hours.  Lipid Profile: No results for input(s): CHOL, HDL, LDLCALC, TRIG, CHOLHDL, LDLDIRECT in the last 72 hours.  Thyroid Function Tests: No results for input(s): TSH, T4TOTAL, FREET4, T3FREE, THYROIDAB in the last 72 hours.  Anemia Panel: No results for input(s): VITAMINB12, FOLATE, FERRITIN, TIBC, IRON, RETICCTPCT in the last 72 hours.  Urine analysis:    Component Value Date/Time   COLORURINE YELLOW 05/17/2017 0545   APPEARANCEUR CLEAR 05/17/2017 0545   LABSPEC  1.016 05/17/2017 0545   PHURINE 5.0 05/17/2017 0545   GLUCOSEU NEGATIVE 05/17/2017 0545   HGBUR NEGATIVE 05/17/2017 0545   BILIRUBINUR NEGATIVE 05/17/2017 0545   KETONESUR 5 (A) 05/17/2017 0545   PROTEINUR NEGATIVE 05/17/2017 0545   NITRITE NEGATIVE 05/17/2017 0545   LEUKOCYTESUR NEGATIVE 05/17/2017 0545    Sepsis Labs: Lactic Acid, Venous No results found for: LATICACIDVEN   Microbiology: No results found for this or any previous visit (from the past 240 hour(s)).    RADIOLOGIC STUDIES ON ADMISSION: No results found.   ASSESSMENT AND PLAN: Acute pancreatitis: Likely secondary to alcohol use, will check limited abdominal ultrasound to make sure he does not have gallstones. Check triglycerides as well. He denies that he is on any prescription medications or any over-the-counter medications-he just recently finished a course of amoxicillin for a year infection. Plan is to admit him to a medical floor, keep him nothing by mouth, generous hydration with normal saline and as needed narcotics and antiemetics. He has been counseled extensively regarding the importance of avoiding alcohol use in the future.   Hypokalemia: Replete and recheck-probably secondary to GI loss.  Tobacco/marijuana abuse: Counseled-will check urine drug  screen-but he denies use of cocaine/heroin etc.  Further plan will depend as patient's clinical course evolves and further radiologic and laboratory data become available. Patient will be monitored closely.  Above noted plan was discussed with patient/friend face to face at bedside, they were in agreement.   CONSULTS: None  DVT Prophylaxis: Prophylactic Lovenox   Code Status: Full Code  Disposition Plan:  Discharge back home possibly in 2-3 days, depending on clinical course  Admission status: Inpatient  going to medical floor  Total time spent  55 minutes.Greater than 50% of this time was spent in counseling, explanation of diagnosis, planning of  further management, and coordination of care.  Jeoffrey Massed [facility] Pager [phone]  If 7PM-7AM, please contact night-coverage [email or web address] Password [facility] 05/17/2017, 9:14 AM

## 2017-05-18 DIAGNOSIS — K852 Alcohol induced acute pancreatitis without necrosis or infection: Principal | ICD-10-CM

## 2017-05-18 DIAGNOSIS — E876 Hypokalemia: Secondary | ICD-10-CM

## 2017-05-18 LAB — COMPREHENSIVE METABOLIC PANEL
ALT: 19 U/L (ref 17–63)
AST: 22 U/L (ref 15–41)
Albumin: 3.8 g/dL (ref 3.5–5.0)
Alkaline Phosphatase: 63 U/L (ref 38–126)
Anion gap: 8 (ref 5–15)
BUN: 8 mg/dL (ref 6–20)
CHLORIDE: 99 mmol/L — AB (ref 101–111)
CO2: 25 mmol/L (ref 22–32)
Calcium: 8.5 mg/dL — ABNORMAL LOW (ref 8.9–10.3)
Creatinine, Ser: 0.84 mg/dL (ref 0.61–1.24)
GFR calc Af Amer: >60 mL/min (ref >60)
GFR calc non Af Amer: >60 mL/min (ref >60)
GLUCOSE: 106 mg/dL — AB (ref 65–99)
Potassium: 3.7 mmol/L (ref 3.5–5.1)
SODIUM: 132 mmol/L — AB (ref 135–145)
Total Bilirubin: 1.4 mg/dL — ABNORMAL HIGH (ref 0.3–1.2)
Total Protein: 6.6 g/dL (ref 6.5–8.1)

## 2017-05-18 LAB — CBC
HCT: 42.4 % (ref 39.0–52.0)
Hemoglobin: 14.5 g/dL (ref 13.0–17.0)
MCH: 33.2 pg (ref 26.0–34.0)
MCHC: 34.2 g/dL (ref 30.0–36.0)
MCV: 97 fL (ref 78.0–100.0)
PLATELETS: 265 10*3/uL (ref 150–400)
RBC: 4.37 MIL/uL (ref 4.22–5.81)
RDW: 13.8 % (ref 11.5–15.5)
WBC: 12.9 10*3/uL — ABNORMAL HIGH (ref 4.0–10.5)

## 2017-05-18 LAB — HIV ANTIBODY (ROUTINE TESTING W REFLEX): HIV Screen 4th Generation wRfx: NONREACTIVE

## 2017-05-18 LAB — LIPASE, BLOOD: Lipase: 112 U/L — ABNORMAL HIGH (ref 11–51)

## 2017-05-18 MED ORDER — PROMETHAZINE HCL 25 MG/ML IJ SOLN
12.5000 mg | Freq: Once | INTRAMUSCULAR | Status: AC
  Administered 2017-05-18: 12.5 mg via INTRAVENOUS
  Filled 2017-05-18: qty 1

## 2017-05-18 NOTE — Progress Notes (Signed)
Patient ID: Steven Mckenzie, male   DOB: [birth year], 21 y.o.   MRN: [id number]    PROGRESS NOTE    Steven Mckenzie  WUJ:[id number] DOB: 11-12-1996 DOA: 05/17/2017  PCP: Pt has no PCP  Brief Narrative:  Pt is 21 yo male, student at [employer], presented to [facility] for evaluation of several days duration of left upper quadrant pain associated with nausea and non bloody vomiting.   Assessment & Plan:   Active Problems:   Acute pancreatitis - in the setting of binge drinking - pt reports feeling better but still with abd pain and nausea - lipase is trending down overall, pt still on IVF - will continue IVF and analgesia as needed - place on full liquid diet to see how pt does - allow antiemetics as needed - check lipase in AM    Alcohol use - unclear how much pt uses but we had long discussion on importance of avoiding alcohol  - pt has verbalized understanding - no signs of withdrawal     Hypokalemia - supplemented and WNL - check CMET in AM  DVT prophylaxis: Lovenox Sq Code Status: Full  Family Communication: Patient and mother at bedside  Disposition Plan: Home once medically stable   Consultants:   None  Procedures:   None  Antimicrobials:   None   Subjective: Pt reports feeling better but still with intermittent left > right sided abd pain, 5/10 in severity and associated with nausea.   Objective: Vitals:   05/17/17 1018 05/17/17 1448 05/17/17 2155 05/18/17 0447  BP: 134/77 128/67 123/67 136/78  Pulse: 66 62 82 (!) 102  Resp: 20 18 18 18   Temp: 97.6 F (36.4 C) 98.2 F (36.8 C) 98.6 F (37 C) 98.8 F (37.1 C)  TempSrc: Oral Oral Oral Oral  SpO2: 100% 100% 100% 100%    Intake/Output Summary (Last 24 hours) at 05/18/17 1134 Last data filed at 05/18/17 9562  Gross per 24 hour  Intake            937.5 ml  Output             2050 ml  Net          -1112.5 ml   There were no vitals filed for this visit.  Examination:  General exam: Appears calm and  comfortable  Respiratory system: Clear to auscultation. Respiratory effort normal. Cardiovascular system: S1 & S2 heard, RRR. No JVD, murmurs, rubs, gallops or clicks. No pedal edema. Gastrointestinal system: Abdomen is nondistended, tender in upper abd quadrants, L > R side Central nervous system: Alert and oriented. No focal neurological deficits. Extremities: Symmetric 5 x 5 power. Skin: No rashes, lesions or ulcers Psychiatry: Judgement and insight appear normal. Mood & affect appropriate.    Data Reviewed: I have personally reviewed following labs and imaging studies  CBC:  Recent Labs Lab 05/17/17 0207 05/17/17 1231 05/18/17 0522  WBC 12.6* 16.9* 12.9*  HGB 15.4 14.9 14.5  HCT 44.1 43.8 42.4  MCV 96.3 97.3 97.0  PLT 369 329 265   Basic Metabolic Panel:  Recent Labs Lab 05/17/17 0207 05/17/17 1231 05/18/17 0522  NA 138  --  132*  K 3.4*  --  3.7  CL 104  --  99*  CO2 21*  --  25  GLUCOSE 149*  --  106*  BUN 14  --  8  CREATININE 0.90 0.72 0.84  CALCIUM 8.8*  --  8.5*   Liver Function Tests:  Recent Labs  Lab 05/17/17 0207 05/18/17 0522  AST 45* 22  ALT 28 19  ALKPHOS 69 63  BILITOT 0.6 1.4*  PROT 7.8 6.6  ALBUMIN 4.3 3.8    Recent Labs Lab 05/17/17 0207 05/18/17 0522  LIPASE 560* 112*   Lipid Profile:  Recent Labs  05/17/17 1231  TRIG 50   Urine analysis:    Component Value Date/Time   COLORURINE YELLOW 05/17/2017 0545   APPEARANCEUR CLEAR 05/17/2017 0545   LABSPEC 1.016 05/17/2017 0545   PHURINE 5.0 05/17/2017 0545   GLUCOSEU NEGATIVE 05/17/2017 0545   HGBUR NEGATIVE 05/17/2017 0545   BILIRUBINUR NEGATIVE 05/17/2017 0545   KETONESUR 5 (A) 05/17/2017 0545   PROTEINUR NEGATIVE 05/17/2017 0545   NITRITE NEGATIVE 05/17/2017 0545   LEUKOCYTESUR NEGATIVE 05/17/2017 0545    Radiology Studies: [place] Abdomen Limited  Result Date: 05/17/2017 CLINICAL DATA:  History of pancreatitis EXAM: US ABDOMEN LIMITED - RIGHT UPPER QUADRANT  COMPARISON:  None. FINDINGS: Gallbladder: No gallstones or wall thickening visualized. No sonographic Murphy sign noted by sonographer. Common bile duct: Diameter: 3 mm Liver: Focal 2 cm hyperechoic area is noted in the anterior aspect of the medial segment of the left lobe of the liver adjacent to the falciform ligament. IMPRESSION: Focal hyperechoic area in the left lobe likely representing a hemangioma. Followup examination in 6 months is recommended to assess for stability. Electronically Signed   By: Alcide CleverMark  Lukens M.D.   On: 05/17/2017 15:23      Scheduled Meds: . enoxaparin (LOVENOX) injection  40 mg Subcutaneous Q24H  . pantoprazole (PROTONIX) IV  40 mg Intravenous Q24H   Continuous Infusions: . sodium chloride 150 mL/hr at 05/18/17 0649     LOS: 1 day   Time spent: 20 minutes   Debbora PrestoIskra Magick-Zanyah Lentsch, MD [facility] Pager [phone]  If 7PM-7AM, please contact night-coverage [email or web address] Password [email or web address] 05/18/2017, 11:34 AM

## 2017-05-19 LAB — COMPREHENSIVE METABOLIC PANEL
ALK PHOS: 69 U/L (ref 38–126)
ALT: 23 U/L (ref 17–63)
AST: 35 U/L (ref 15–41)
Albumin: 3.3 g/dL — ABNORMAL LOW (ref 3.5–5.0)
Anion gap: 10 (ref 5–15)
BUN: 6 mg/dL (ref 6–20)
CO2: 27 mmol/L (ref 22–32)
CREATININE: 0.79 mg/dL (ref 0.61–1.24)
Calcium: 8.8 mg/dL — ABNORMAL LOW (ref 8.9–10.3)
Chloride: 97 mmol/L — ABNORMAL LOW (ref 101–111)
GFR calc Af Amer: >60 mL/min (ref >60)
Glucose, Bld: 97 mg/dL (ref 65–99)
Potassium: 3.6 mmol/L (ref 3.5–5.1)
Sodium: 134 mmol/L — ABNORMAL LOW (ref 135–145)
TOTAL PROTEIN: 6.6 g/dL (ref 6.5–8.1)
Total Bilirubin: 1.7 mg/dL — ABNORMAL HIGH (ref 0.3–1.2)

## 2017-05-19 LAB — LIPASE, BLOOD: LIPASE: 41 U/L (ref 11–51)

## 2017-05-19 LAB — MAGNESIUM: Magnesium: 1.7 mg/dL (ref 1.7–2.4)

## 2017-05-19 MED ORDER — PROMETHAZINE HCL 25 MG/ML IJ SOLN
12.5000 mg | Freq: Once | INTRAMUSCULAR | Status: DC
  Filled 2017-05-19: qty 1

## 2017-05-19 MED ORDER — DIPHENHYDRAMINE HCL 50 MG/ML IJ SOLN
25.0000 mg | Freq: Once | INTRAMUSCULAR | Status: AC
  Administered 2017-05-19: 25 mg via INTRAVENOUS
  Filled 2017-05-19: qty 1

## 2017-05-19 MED ORDER — POTASSIUM CHLORIDE CRYS ER 20 MEQ PO TBCR: 40.0000 meq | EXTENDED_RELEASE_TABLET | Freq: Once | ORAL | Status: DC

## 2017-05-19 MED ORDER — BISACODYL 10 MG RE SUPP
10.0000 mg | Freq: Once | RECTAL | Status: DC
  Filled 2017-05-19: qty 1

## 2017-05-19 NOTE — Progress Notes (Signed)
Patient ID: Steven Mckenzie, male   DOB: 10-30-1996, 21 y.o.   MRN: [id number]    PROGRESS NOTE    Steven Mckenzie  [id number] DOB: 10-30-1996 DOA: 05/17/2017  PCP: Patient, No Pcp Per   Brief Narrative:  Pt is 21 yo male, student at [employer], [facility] several days duration of left upper quadrant pain associated with nausea and non bloody vomiting.   Assessment & Plan:   Active Problems:   Acute pancreatitis - in the setting of binge drinking - Patient with some improvement overall but reports persistent pain in left upper abdominal quadrant - Lipase is now within normal limits - Diet has been advanced to full liquids but patient still with a rather poor oral intake - I will continue IV fluids for now - Also continue analgesia and antiemetics as needed - Plan to advance diet further if patient able to tolerate - Repeat lipase again in the morning    Fever - Noted overnight - Maximum temperature 101.1 F - Currently afebrile however worried about possible complications of acute pancreatitis including development of pseudocyst - We'll continue to monitor closely - It is encouraging that WBC is trending down: 16.9 --> 12.9 - Repeat WBC in the morning    Alcohol use - consultation cessation already provided    Hypokalemia - Has been supplemented but still on low side of normal  - We'll check a magnesium level  - Give additional dose of potassium today     Hyponatremia - From prerenal etiology in the setting of acute pancreatitis - Continue IV fluids - BMP in the morning  DVT prophylaxis: Lovenox SQ Code Status: Full Family Communication: Patient and mother at bedside  Disposition Plan: Home once medically stable  Consultants:   None  Procedures:   None  Antimicrobials:   None  Subjective: Patient reports persistent intermittent pain mostly in left upper quadrant abdominal area.  Objective: Vitals:   05/18/17 1800 05/18/17  2118 05/19/17 0119 05/19/17 0620  BP: 130/81 109/65 107/70 119/63  Pulse: (!) 109 93 79 77  Resp: 16 16 16 16   Temp: 100 F (37.8 C) (!) 101.1 F (38.4 C) 98.8 F (37.1 C) 99.8 F (37.7 C)  TempSrc: Oral Oral Oral Oral  SpO2: 98% 96% 100% 100%  Weight: 76.9 kg (169 lb 8 oz)     Height: 5' 10.5" (1.791 m)       Intake/Output Summary (Last 24 hours) at 05/19/17 1121 Last data filed at 05/19/17 0929  Gross per 24 hour  Intake             4020 ml  Output             2800 ml  Net             1220 ml   Filed Weights   05/18/17 1800  Weight: 76.9 kg (169 lb 8 oz)    Examination:  General exam: Appears calm and comfortable  Respiratory system: Clear to auscultation. Respiratory effort normal. Cardiovascular system: S1 & S2 heard, RRR. No JVD, murmurs, rubs, gallops or clicks. No pedal edema. Gastrointestinal system: Abdomen is nondistended, slightly tender in left upper abdominal quadrant  Central nervous system: Alert and oriented. No focal neurological deficits. Extremities: Symmetric 5 x 5 power. Skin: No rashes, lesions or ulcers Psychiatry: Judgement and insight appear normal. Mood & affect appropriate.   Data Reviewed: I have personally reviewed following labs and imaging studies  CBC:  Recent  Labs Lab 05/17/17 0207 05/17/17 1231 05/18/17 0522  WBC 12.6* 16.9* 12.9*  HGB 15.4 14.9 14.5  HCT 44.1 43.8 42.4  MCV 96.3 97.3 97.0  PLT 369 329 265   Basic Metabolic Panel:  Recent Labs Lab 05/17/17 0207 05/17/17 1231 05/18/17 0522 05/19/17 0450  NA 138  --  132* 134*  K 3.4*  --  3.7 3.6  CL 104  --  99* 97*  CO2 21*  --  25 27  GLUCOSE 149*  --  106* 97  BUN 14  --  8 6  CREATININE 0.90 0.72 0.84 0.79  CALCIUM 8.8*  --  8.5* 8.8*   Liver Function Tests:  Recent Labs Lab 05/17/17 0207 05/18/17 0522 05/19/17 0450  AST 45* 22 35  ALT 28 19 23   ALKPHOS 69 63 69  BILITOT 0.6 1.4* 1.7*  PROT 7.8 6.6 6.6  ALBUMIN 4.3 3.8 3.3*    Recent Labs Lab  05/17/17 0207 05/18/17 0522 05/19/17 0450  LIPASE 560* 112* 41   Lipid Profile:  Recent Labs  05/17/17 1231  TRIG 50   Urine analysis:    Component Value Date/Time   COLORURINE YELLOW 05/17/2017 0545   APPEARANCEUR CLEAR 05/17/2017 0545   LABSPEC 1.016 05/17/2017 0545   PHURINE 5.0 05/17/2017 0545   GLUCOSEU NEGATIVE 05/17/2017 0545   HGBUR NEGATIVE 05/17/2017 0545   BILIRUBINUR NEGATIVE 05/17/2017 0545   KETONESUR 5 (A) 05/17/2017 0545   PROTEINUR NEGATIVE 05/17/2017 0545   NITRITE NEGATIVE 05/17/2017 0545   LEUKOCYTESUR NEGATIVE 05/17/2017 0545    Radiology Studies: US Abdomen Limited  Result Date: 05/17/2017 CLINICAL DATA:  History of pancreatitis EXAM: US ABDOMEN LIMITED - RIGHT UPPER QUADRANT COMPARISON:  None. FINDINGS: Gallbladder: No gallstones or wall thickening visualized. No sonographic Murphy sign noted by sonographer. Common bile duct: Diameter: 3 mm Liver: Focal 2 cm hyperechoic area is noted in the anterior aspect of the medial segment of the left lobe of the liver adjacent to the falciform ligament. IMPRESSION: Focal hyperechoic area in the left lobe likely representing a hemangioma. Followup examination in 6 months is recommended to assess for stability. Electronically Signed   By: Alcide Clever M.D.   On: 05/17/2017 15:23   Scheduled Meds: . enoxaparin (LOVENOX) injection  40 mg Subcutaneous Q24H  . pantoprazole (PROTONIX) IV  40 mg Intravenous Q24H   Continuous Infusions: . sodium chloride 125 mL/hr at 05/19/17 0540     LOS: 2 days   Time spent: 20 minutes   Debbora Presto, MD [facility] Pager [phone]  If 7PM-7AM, please contact night-coverage [email or web address] Password [facility] 05/19/2017, 11:21 AM

## 2017-05-20 LAB — COMPREHENSIVE METABOLIC PANEL
ALT: 86 U/L — AB (ref 17–63)
AST: 100 U/L — ABNORMAL HIGH (ref 15–41)
Albumin: 3.5 g/dL (ref 3.5–5.0)
Alkaline Phosphatase: 120 U/L (ref 38–126)
Anion gap: 9 (ref 5–15)
BUN: 5 mg/dL — ABNORMAL LOW (ref 6–20)
CO2: 29 mmol/L (ref 22–32)
Calcium: 8.9 mg/dL (ref 8.9–10.3)
Chloride: 100 mmol/L — ABNORMAL LOW (ref 101–111)
Creatinine, Ser: 0.85 mg/dL (ref 0.61–1.24)
Glucose, Bld: 109 mg/dL — ABNORMAL HIGH (ref 65–99)
POTASSIUM: 3.4 mmol/L — AB (ref 3.5–5.1)
SODIUM: 138 mmol/L (ref 135–145)
Total Bilirubin: 1.5 mg/dL — ABNORMAL HIGH (ref 0.3–1.2)
Total Protein: 7.1 g/dL (ref 6.5–8.1)

## 2017-05-20 LAB — CBC
HEMATOCRIT: 38.6 % — AB (ref 39.0–52.0)
HEMOGLOBIN: 13.3 g/dL (ref 13.0–17.0)
MCH: 33.4 pg (ref 26.0–34.0)
MCHC: 34.5 g/dL (ref 30.0–36.0)
MCV: 97 fL (ref 78.0–100.0)
PLATELETS: 259 10*3/uL (ref 150–400)
RBC: 3.98 MIL/uL — AB (ref 4.22–5.81)
RDW: 13.3 % (ref 11.5–15.5)
WBC: 9.6 10*3/uL (ref 4.0–10.5)

## 2017-05-20 LAB — LIPASE, BLOOD: Lipase: 32 U/L (ref 11–51)

## 2017-05-20 LAB — MAGNESIUM: Magnesium: 1.8 mg/dL (ref 1.7–2.4)

## 2017-05-20 MED ORDER — POTASSIUM CHLORIDE CRYS ER 20 MEQ PO TBCR
40.0000 meq | EXTENDED_RELEASE_TABLET | Freq: Once | ORAL | Status: AC
  Administered 2017-05-20: 40 meq via ORAL
  Filled 2017-05-20: qty 2

## 2017-05-20 MED ORDER — ONDANSETRON HCL 4 MG PO TABS: 4.0000 mg | ORAL_TABLET | Freq: Four times a day (QID) | ORAL | 0 refills | Status: DC | PRN

## 2017-05-20 MED ORDER — OXYCODONE HCL 5 MG PO TABS: 5.0000 mg | ORAL_TABLET | ORAL | 0 refills | Status: DC | PRN

## 2017-05-20 NOTE — Discharge Instructions (Signed)
Acute Pancreatitis  Acute pancreatitis is a condition in which the pancreas suddenly gets irritated and swollen (has inflammation). The pancreas is a large gland behind the stomach. It makes enzymes that help to digest food. The pancreas also makes hormones that help to control your blood sugar. Acute pancreatitis happens when the enzymes attack the pancreas and damage it. Most attacks last a couple of days and can cause serious problems.  Follow these instructions at home:  Eating and drinking   · Follow instructions from your doctor about diet. You may need to:  ? Avoid alcohol.  ? Limit how much fat is in your diet.  · Eat small meals often. Avoid eating big meals.  · Drink enough fluid to keep your pee (urine) clear or pale yellow.  · Do not drink alcohol if it caused your condition.  General instructions   · Take over-the-counter and prescription medicines only as told by your doctor.  · Do not use any tobacco products. These include cigarettes, chewing tobacco, and e-cigarettes. If you need help quitting, ask your doctor.  · Get plenty of rest.  · If directed, check your blood sugar at home as told by your doctor.  · Keep all follow-up visits as told by your doctor. This is important.  Contact a doctor if:  · You do not get better as quickly as expected.  · You have new symptoms.  · Your symptoms get worse.  · You have lasting pain or weakness.  · You continue to feel sick to your stomach (nauseous).  · You get better and then you have another pain attack.  · You have a fever.  Get help right away if:  · You cannot eat or keep fluids down.  · Your pain becomes very bad.  · Your skin or the white part of your eyes turns yellow (jaundice).  · You throw up (vomit).  · You feel dizzy or you pass out (faint).  · Your blood sugar is high (over 300 mg/dL).  This information is not intended to replace advice given to you by your health care provider. Make sure you discuss any questions you have with your health care  provider.  Document Released: 05/31/2008 Document Revised: 05/20/2016 Document Reviewed: 09/16/2015  Elsevier Interactive Patient Education © 2017 [employer]

## 2017-05-20 NOTE — Progress Notes (Signed)
Patient d/c home. Stable. 

## 2017-05-20 NOTE — Discharge Summary (Signed)
Physician Discharge Summary  Steven Mckenzie ZOX:[id number] DOB: 07-31-1996 DOA: 05/17/2017  PCP: Steven Mckenzie, No Pcp Per  Admit date: 05/17/2017 Discharge date: 05/20/2017  Recommendations for Outpatient Follow-up:  1. Pt will need to follow up with PCP in 1 week post discharge 2. Please obtain lipase level and CMET to make sure stable liver enzymes, also check K level and supplement as needed  3. Please note that liver enzymes slightly elevated on discharge but pt wanted to go home so advised to have close follow up with PCP   Discharge Diagnoses:  Active Problems:   Acute pancreatitis   Hypokalemia      Discharge Condition: Stable  Diet recommendation: Heart healthy diet discussed in details   History of present illness:  Pt is 21 yo male, student at [employer], presented to [facility] for evaluation of several days duration of left upper quadrant pain associated with nausea and non bloody vomiting.   Assessment & Plan:  Active Problems: Acute pancreatitis - in the setting of binge drinking - pt improved, tolerating solids well - wants to go home - advised on importance of alcohol cessation and he has verbalized understanding  - pain controlled on oral analgesia     Fever - Noted overnight 5/24 - has been afebrile since, stable WBC - pt wants to go home today  - advised to monitor this at home and to report any fever of 101 F or more to PCP  Alcohol use, transaminitis  - had discussion with pt and his mom at bedside, they have both verbalized understanding and appreciated time spent  - LFTs again slightly high this AM but pt wants to go home - he was advised to have close follow up with PCP  Hypokalemia - supplemented prior to discharge     Hyponatremia - resolved with IVF  DVT prophylaxis: Lovenox SQ Code Status: Full Family Communication: Steven Mckenzie and mother at bedside  Disposition Plan: Home   Consultants:   None  Procedures:    None  Antimicrobials:   None   Procedures/Studies: US Abdomen Limited  Result Date: 05/17/2017 CLINICAL DATA:  History of pancreatitis EXAM: US ABDOMEN LIMITED - RIGHT UPPER QUADRANT COMPARISON:  None. FINDINGS: Gallbladder: No gallstones or wall thickening visualized. No sonographic Murphy sign noted by sonographer. Common bile duct: Diameter: 3 mm Liver: Focal 2 cm hyperechoic area is noted in the anterior aspect of the medial segment of the left lobe of the liver adjacent to the falciform ligament. IMPRESSION: Focal hyperechoic area in the left lobe likely representing a hemangioma. Followup examination in 6 months is recommended to assess for stability. Electronically Signed   By: Alcide Clever M.D.   On: 05/17/2017 15:23   Dg Hand Complete Left  Result Date: 05/07/2017 CLINICAL DATA:  Acute onset of left hand injury, with pain. Initial encounter. EXAM: LEFT HAND - COMPLETE 3+ VIEW COMPARISON:  None. FINDINGS: There is no evidence of fracture or dislocation. The joint spaces are preserved. The carpal rows are intact, and demonstrate normal alignment. The soft tissues are unremarkable in appearance. IMPRESSION: No evidence of fracture or dislocation. Electronically Signed   By: Roanna Raider M.D.   On: 05/07/2017 02:28     Discharge Exam: Vitals:   05/19/17 2133 05/20/17 0505  BP: 127/76 116/64  Pulse: 93 71  Resp: 16 16  Temp: 99.1 F (37.3 C) 98.3 F (36.8 C)   Vitals:   05/19/17 0620 05/19/17 1448 05/19/17 2133 05/20/17 0505  BP: 119/63 123/66  127/76 116/64  Pulse: 77 74 93 71  Resp: 16 16 16 16   Temp: 99.8 F (37.7 C) 99.4 F (37.4 C) 99.1 F (37.3 C) 98.3 F (36.8 C)  TempSrc: Oral Oral Rectal Oral  SpO2: 100% 100% 99% 96%  Weight:      Height:        General: Pt is alert, follows commands appropriately, not in acute distress Cardiovascular: Regular rate and rhythm, S1/S2 +, no murmurs, no rubs, no gallops Respiratory: Clear to auscultation bilaterally, no  wheezing, no crackles, no rhonchi Abdominal: Soft, non tender, non distended, bowel sounds +, no guarding Extremities: no edema, no cyanosis, pulses palpable bilaterally DP and PT Neuro: Grossly nonfocal  Discharge Instructions  Discharge Instructions    Diet - low sodium heart healthy    Complete by:  As directed    Increase activity slowly    Complete by:  As directed      Allergies as of 05/20/2017      Reactions   Banana    Celery Oil    Celery    Pineapple       Medication List    STOP taking these medications   amoxicillin 500 MG capsule Commonly known as:  AMOXIL     TAKE these medications   ondansetron 4 MG tablet Commonly known as:  ZOFRAN Take 1 tablet (4 mg total) by mouth every 6 (six) hours as needed for nausea.   oxyCODONE 5 MG immediate release tablet Commonly known as:  Oxy IR/ROXICODONE Take 1 tablet (5 mg total) by mouth every 4 (four) hours as needed for moderate pain or severe pain.      Follow-up Information    Dorothea Ogle, MD Follow up.   Specialty:  Internal Medicine Contact information: [address] [place] [address] [phone]            The results of significant diagnostics from this hospitalization (including imaging, microbiology, ancillary and laboratory) are listed below for reference.     Microbiology: No results found for this or any previous visit (from the past 240 hour(s)).   Labs: Basic Metabolic Panel:  Recent Labs Lab 05/17/17 0207 05/17/17 1231 05/18/17 0522 05/19/17 0447 05/19/17 0450 05/20/17 0406  NA 138  --  132*  --  134* 138  K 3.4*  --  3.7  --  3.6 3.4*  CL 104  --  99*  --  97* 100*  CO2 21*  --  25  --  27 29  GLUCOSE 149*  --  106*  --  97 109*  BUN 14  --  8  --  6 5*  CREATININE 0.90 0.72 0.84  --  0.79 0.85  CALCIUM 8.8*  --  8.5*  --  8.8* 8.9  MG  --   --   --  1.7  --  1.8   Liver Function Tests:  Recent Labs Lab 05/17/17 0207 05/18/17 0522  05/19/17 0450 05/20/17 0406  AST 45* 22 35 100*  ALT 28 19 23  86*  ALKPHOS 69 63 69 120  BILITOT 0.6 1.4* 1.7* 1.5*  PROT 7.8 6.6 6.6 7.1  ALBUMIN 4.3 3.8 3.3* 3.5    Recent Labs Lab 05/17/17 0207 05/18/17 0522 05/19/17 0450 05/20/17 0406  LIPASE 560* 112* 41 32   CBC:  Recent Labs Lab 05/17/17 0207 05/17/17 1231 05/18/17 0522 05/20/17 0406  WBC 12.6* 16.9* 12.9* 9.6  HGB 15.4 14.9 14.5 13.3  HCT  44.1 43.8 42.4 38.6*  MCV 96.3 97.3 97.0 97.0  PLT 369 329 265 259   SIGNED: Time coordinating discharge: 30 minutes  Debbora PrestoIskra Magick-Jermy Couper, MD  [facility] 05/20/2017, 10:59 AM Pager [phone]  If 7PM-7AM, please contact night-coverage [email or web address] Password [email or web address]

## 2017-05-20 NOTE — Progress Notes (Signed)
Pt and mom expressed concern that he hadn't had a stool since Monday. I notified provider on call, and obtained order for a Dulcolax suppository.  Pt then decided that he did not want to take it now, and would rather do so in the am.

## 2017-05-20 NOTE — Progress Notes (Signed)
Discussed d/c instructions with patient,prescription given,verbalized understanding. All questions answered appropriately. Denies pain.

## 2017-05-20 NOTE — Progress Notes (Signed)
Pt c/o that Zofran was not helping his nausea. I notirfied provider on call, obtained a one time order for Phenergan. Pt later decided that he was not longer nauseous. Pt then c/o itching, obtained order for Benadryl. Administered the med. Pt states he feels better.

## 2017-06-16 ENCOUNTER — Emergency Department (HOSPITAL_COMMUNITY)
Admission: EM | Admit: 2017-06-16 | Discharge: 2017-06-17 | Disposition: A | Discharge status: Home / Self Care | Payer: BLUE CROSS/BLUE SHIELD | Attending: Emergency Medicine | Admitting: Emergency Medicine

## 2017-06-16 ENCOUNTER — Emergency Department (HOSPITAL_COMMUNITY): Payer: BLUE CROSS/BLUE SHIELD

## 2017-06-16 ENCOUNTER — Encounter (HOSPITAL_COMMUNITY): Payer: Self-pay | Admitting: Emergency Medicine

## 2017-06-16 DIAGNOSIS — W06XXXA Fall from bed, initial encounter: Secondary | ICD-10-CM | POA: Insufficient documentation

## 2017-06-16 DIAGNOSIS — S60511A Abrasion of right hand, initial encounter: Secondary | ICD-10-CM | POA: Diagnosis not present

## 2017-06-16 DIAGNOSIS — Y929 Unspecified place or not applicable: Secondary | ICD-10-CM | POA: Insufficient documentation

## 2017-06-16 DIAGNOSIS — Y998 Other external cause status: Secondary | ICD-10-CM | POA: Insufficient documentation

## 2017-06-16 DIAGNOSIS — R1013 Epigastric pain: Secondary | ICD-10-CM

## 2017-06-16 DIAGNOSIS — R112 Nausea with vomiting, unspecified: Secondary | ICD-10-CM | POA: Insufficient documentation

## 2017-06-16 DIAGNOSIS — F1010 Alcohol abuse, uncomplicated: Secondary | ICD-10-CM | POA: Diagnosis not present

## 2017-06-16 DIAGNOSIS — S59902A Unspecified injury of left elbow, initial encounter: Secondary | ICD-10-CM | POA: Insufficient documentation

## 2017-06-16 DIAGNOSIS — R1011 Right upper quadrant pain: Secondary | ICD-10-CM | POA: Insufficient documentation

## 2017-06-16 DIAGNOSIS — F17210 Nicotine dependence, cigarettes, uncomplicated: Secondary | ICD-10-CM | POA: Insufficient documentation

## 2017-06-16 DIAGNOSIS — Y939 Activity, unspecified: Secondary | ICD-10-CM | POA: Insufficient documentation

## 2017-06-16 DIAGNOSIS — Y92009 Unspecified place in unspecified non-institutional (private) residence as the place of occurrence of the external cause: Secondary | ICD-10-CM

## 2017-06-16 DIAGNOSIS — W19XXXA Unspecified fall, initial encounter: Secondary | ICD-10-CM

## 2017-06-16 HISTORY — DX: Acute pancreatitis without necrosis or infection, unspecified: K85.90

## 2017-06-16 LAB — URINALYSIS, ROUTINE W REFLEX MICROSCOPIC
Bilirubin Urine: NEGATIVE
GLUCOSE, UA: NEGATIVE mg/dL
Hgb urine dipstick: NEGATIVE
Ketones, ur: 5 mg/dL — AB
LEUKOCYTES UA: NEGATIVE
NITRITE: NEGATIVE
Protein, ur: NEGATIVE mg/dL
SPECIFIC GRAVITY, URINE: 1.021 (ref 1.005–1.030)
pH: 5 (ref 5.0–8.0)

## 2017-06-16 LAB — CBC
HEMATOCRIT: 43.5 % (ref 39.0–52.0)
HEMOGLOBIN: 15 g/dL (ref 13.0–17.0)
MCH: 33.1 pg (ref 26.0–34.0)
MCHC: 34.5 g/dL (ref 30.0–36.0)
MCV: 96 fL (ref 78.0–100.0)
Platelets: 276 10*3/uL (ref 150–400)
RBC: 4.53 MIL/uL (ref 4.22–5.81)
RDW: 14.8 % (ref 11.5–15.5)
WBC: 8.1 10*3/uL (ref 4.0–10.5)

## 2017-06-16 LAB — COMPREHENSIVE METABOLIC PANEL
ALT: 24 U/L (ref 17–63)
ANION GAP: 12 (ref 5–15)
AST: 34 U/L (ref 15–41)
Albumin: 4.5 g/dL (ref 3.5–5.0)
Alkaline Phosphatase: 81 U/L (ref 38–126)
BUN: 11 mg/dL (ref 6–20)
CHLORIDE: 109 mmol/L (ref 101–111)
CO2: 23 mmol/L (ref 22–32)
Calcium: 8.7 mg/dL — ABNORMAL LOW (ref 8.9–10.3)
Creatinine, Ser: 1.11 mg/dL (ref 0.61–1.24)
GFR calc Af Amer: >60 mL/min (ref >60)
Glucose, Bld: 106 mg/dL — ABNORMAL HIGH (ref 65–99)
POTASSIUM: 3.7 mmol/L (ref 3.5–5.1)
Sodium: 144 mmol/L (ref 135–145)
TOTAL PROTEIN: 7.6 g/dL (ref 6.5–8.1)
Total Bilirubin: 0.7 mg/dL (ref 0.3–1.2)

## 2017-06-16 LAB — LIPASE, BLOOD: LIPASE: 31 U/L (ref 11–51)

## 2017-06-16 MED ORDER — PANTOPRAZOLE SODIUM 40 MG IV SOLR
40.0000 mg | Freq: Once | INTRAVENOUS | Status: AC
  Administered 2017-06-17: 40 mg via INTRAVENOUS
  Filled 2017-06-16: qty 40

## 2017-06-16 MED ORDER — SODIUM CHLORIDE 0.9 % IV BOLUS (SEPSIS)
1000.0000 mL | Freq: Once | INTRAVENOUS | Status: AC
  Administered 2017-06-17: 1000 mL via INTRAVENOUS

## 2017-06-16 MED ORDER — PROMETHAZINE HCL 25 MG/ML IJ SOLN
12.5000 mg | Freq: Once | INTRAMUSCULAR | Status: AC
  Administered 2017-06-17: 12.5 mg via INTRAVENOUS
  Filled 2017-06-16: qty 1

## 2017-06-16 MED ORDER — ONDANSETRON 4 MG PO TBDP
4.0000 mg | ORAL_TABLET | Freq: Once | ORAL | Status: AC
  Administered 2017-06-16: 4 mg via ORAL
  Filled 2017-06-16: qty 1

## 2017-06-16 NOTE — ED Provider Notes (Addendum)
[facility]-EMERGENCY DEPT Provider Note: Lowella Dell, MD, FACEP  CSN: [id number] MRN: [id number] ARRIVAL: 06/16/17 at 1956 ROOM: WA03/[facility]03   CHIEF COMPLAINT  Abdominal Pain and Arm Injury   HISTORY OF PRESENT ILLNESS  Steven Mckenzie is a 21 y.o. male with a history of pancreatitis. He is here with abdominal pain which is similar to previous pancreatic pain. The pain is located in the left upper quadrant and is described as a twisting sensation. He rates it as an 8 out of 10. It began earlier today after consuming alcohol. He had some transient relief after drinking milk but the pain returned in about 30 minutes. There is been associated nausea and vomiting. He was given Zofran ODT in triage but states that that did not help. He states Zofran IV does not help either. He fell earlier today but cannot say when. He admits that this was due to drinking alcohol. He is having pain in his left elbow and is not able to fully extend his left elbow. He rates his elbow pain is a 4 out of 10. He also has an abrasion to the palm of the right hand. He denies diarrhea.  Consultation with the [facility] state controlled substances database reveals the patient has received 2 prescriptions for oxycodone in the past year and one prescription for tramadol the past year..   Past Medical History:  Diagnosis Date  . Pancreatitis     History reviewed. No pertinent surgical history.  History reviewed. No pertinent family history.  Social History  Substance Use Topics  . Smoking status: Current Some Day Smoker    Packs/day: 0.15    Types: Cigarettes  . Smokeless tobacco: Never Used  . Alcohol use Yes     Comment: daily     Prior to Admission medications   Medication Sig Start Date End Date Taking? Authorizing Provider  ondansetron (ZOFRAN) 4 MG tablet Take 1 tablet (4 mg total) by mouth every 6 (six) hours as needed for nausea. Patient not taking: Reported on 06/16/2017 05/20/17   Dorothea Ogle, MD    oxyCODONE (OXY IR/ROXICODONE) 5 MG immediate release tablet Take 1 tablet (5 mg total) by mouth every 4 (four) hours as needed for moderate pain or severe pain. Patient not taking: Reported on 06/16/2017 05/20/17   Dorothea Ogle, MD    Allergies Banana; Celery oil; and Pineapple   REVIEW OF SYSTEMS  Negative except as noted here or in the History of Present Illness.   PHYSICAL EXAMINATION  Initial Vital Signs Blood pressure 126/71, pulse (!) 103, temperature 99 F (37.2 C), temperature source Oral, resp. rate 16, height 5\' 10"  (1.778 m), weight 76.7 kg (169 lb), SpO2 98 %.  Examination General: Well-developed, well-nourished male in no acute distress; appearance consistent with age of record HENT: normocephalic; atraumatic Eyes: pupils equal, round and reactive to light; extraocular muscles intact Neck: supple Heart: regular rate and rhythm Lungs: clear to auscultation bilaterally Abdomen: soft; nondistended; left upper quadrant tenderness; no masses or hepatosplenomegaly; bowel sounds present Extremities: No deformity; full range of motion except left elbow; pulses normal; tenderness and mild swelling of left elbow with limited range of motion, notably extension, left upper extremity distally neurovascularly intact Neurologic: Awake, alert and oriented; motor function intact in all extremities and symmetric; no facial droop Skin: Warm and dry; abrasion right palm Psychiatric: Normal mood and affect   RESULTS  Summary of this visit's results, reviewed by myself:   EKG Interpretation  Date/Time:  Ventricular Rate:    PR Interval:    QRS Duration:   QT Interval:    QTC Calculation:   R Axis:     Text Interpretation:        Laboratory Studies: Results for orders placed or performed during the hospital encounter of 06/16/17 (from the past 24 hour(s))  Lipase, blood     Status: None   Collection Time: 06/16/17  9:06 PM  Result Value Ref Range   Lipase 31 11 - 51 U/L   Comprehensive metabolic panel     Status: Abnormal   Collection Time: 06/16/17  9:06 PM  Result Value Ref Range   Sodium 144 135 - 145 mmol/L   Potassium 3.7 3.5 - 5.1 mmol/L   Chloride 109 101 - 111 mmol/L   CO2 23 22 - 32 mmol/L   Glucose, Bld 106 (H) 65 - 99 mg/dL   BUN 11 6 - 20 mg/dL   Creatinine, Ser 4.541.11 0.61 - 1.24 mg/dL   Calcium 8.7 (L) 8.9 - 10.3 mg/dL   Total Protein 7.6 6.5 - 8.1 g/dL   Albumin 4.5 3.5 - 5.0 g/dL   AST 34 15 - 41 U/L   ALT 24 17 - 63 U/L   Alkaline Phosphatase 81 38 - 126 U/L   Total Bilirubin 0.7 0.3 - 1.2 mg/dL   GFR calc non Af Amer >60 >60 mL/min   GFR calc Af Amer >60 >60 mL/min   Anion gap 12 5 - 15  CBC     Status: None   Collection Time: 06/16/17  9:06 PM  Result Value Ref Range   WBC 8.1 4.0 - 10.5 K/uL   RBC 4.53 4.22 - 5.81 MIL/uL   Hemoglobin 15.0 13.0 - 17.0 g/dL   HCT 09.843.5 11.939.0 - 14.752.0 %   MCV 96.0 78.0 - 100.0 fL   MCH 33.1 26.0 - 34.0 pg   MCHC 34.5 30.0 - 36.0 g/dL   RDW 82.914.8 56.211.5 - 13.015.5 %   Platelets 276 150 - 400 K/uL  Urinalysis, Routine w reflex microscopic     Status: Abnormal   Collection Time: 06/16/17 10:11 PM  Result Value Ref Range   Color, Urine YELLOW YELLOW   APPearance CLEAR CLEAR   Specific Gravity, Urine 1.021 1.005 - 1.030   pH 5.0 5.0 - 8.0   Glucose, UA NEGATIVE NEGATIVE mg/dL   Hgb urine dipstick NEGATIVE NEGATIVE   Bilirubin Urine NEGATIVE NEGATIVE   Ketones, ur 5 (A) NEGATIVE mg/dL   Protein, ur NEGATIVE NEGATIVE mg/dL   Nitrite NEGATIVE NEGATIVE   Leukocytes, UA NEGATIVE NEGATIVE   Imaging Studies: Dg Elbow Complete Left  Result Date: 06/16/2017 CLINICAL DATA:  Acute onset of diffuse left elbow pain after fall out of bed. Initial encounter. EXAM: LEFT ELBOW - COMPLETE 3+ VIEW COMPARISON:  None. FINDINGS: There is no evidence of fracture or dislocation. The visualized joint spaces are preserved. No significant joint effusion is identified. The soft tissues are unremarkable in appearance.  IMPRESSION: No evidence of fracture or dislocation. Electronically Signed   By: Roanna RaiderJeffery  Chang M.D.   On: 06/16/2017 23:54    ED COURSE  Nursing notes and initial vitals signs, including pulse oximetry, reviewed.  Vitals:   06/16/17 2029 06/16/17 2253 06/17/17 0059  BP: 131/83 126/71 107/80  Pulse: 100 (!) 103 100  Resp: 16 16 20   Temp: 99 F (37.2 C)  98.8 F (37.1 C)  TempSrc: Oral  Oral  SpO2: 96% 98% 99%  Weight: 76.7 kg (169 lb)    Height: 5\' 10"  (1.778 m)     2:23 AM Patient able to tolerate oral intake without emesis. Patient states he refuses to stop drinking. He acknowledges that drinking puts him at risk of recurrent pancreatitis. His lipase is normal at the present time.  PROCEDURES    ED DIAGNOSES     ICD-10-CM   1. Epigastric pain R10.13   2. Alcohol abuse F10.10   3. Fall in home, initial encounter W19.XXXA    Y92.009   4. Elbow injury, left, initial encounter O96.295M        Paula Libra, MD 06/17/17 8413    Paula Libra, MD 06/17/17 0230

## 2017-06-16 NOTE — ED Triage Notes (Signed)
Pt reports having abd pain along with nausea and vomiting. Pt states that the pain caused him to fall and land on left arm. Pt states pain in arm is located to elbow.

## 2017-06-16 NOTE — ED Notes (Signed)
Pt with hx of pancreatitis reports drinking, and stated he has been here twice recently for the condition. Pt fell out of bed today on his arm and states 4/10 arm and 10/10 with movement, no obvious deformities.

## 2017-06-17 MED ORDER — SUCRALFATE 1 G PO TABS
1.0000 g | ORAL_TABLET | Freq: Once | ORAL | Status: AC
  Administered 2017-06-17: 1 g via ORAL
  Filled 2017-06-17: qty 1

## 2017-06-17 MED ORDER — OXYCODONE-ACETAMINOPHEN 5-325 MG PO TABS
2.0000 | ORAL_TABLET | Freq: Once | ORAL | Status: AC
  Administered 2017-06-17: 2 via ORAL
  Filled 2017-06-17: qty 2

## 2017-06-17 MED ORDER — OXYCODONE HCL 5 MG PO TABS: 5.0000 mg | ORAL_TABLET | ORAL | 0 refills | Status: DC | PRN

## 2017-06-17 MED ORDER — PROMETHAZINE HCL 25 MG PO TABS: 25.0000 mg | ORAL_TABLET | Freq: Four times a day (QID) | ORAL | 0 refills | Status: DC | PRN

## 2017-08-28 ENCOUNTER — Encounter (HOSPITAL_COMMUNITY): Payer: Self-pay | Admitting: *Deleted

## 2017-08-28 ENCOUNTER — Emergency Department (HOSPITAL_COMMUNITY)
Admission: EM | Admit: 2017-08-28 | Discharge: 2017-08-28 | Disposition: A | Discharge status: Home / Self Care | Payer: BLUE CROSS/BLUE SHIELD | Attending: Emergency Medicine | Admitting: Emergency Medicine

## 2017-08-28 DIAGNOSIS — R1012 Left upper quadrant pain: Secondary | ICD-10-CM | POA: Insufficient documentation

## 2017-08-28 DIAGNOSIS — Z87891 Personal history of nicotine dependence: Secondary | ICD-10-CM | POA: Diagnosis not present

## 2017-08-28 DIAGNOSIS — R112 Nausea with vomiting, unspecified: Secondary | ICD-10-CM | POA: Diagnosis not present

## 2017-08-28 DIAGNOSIS — Z789 Other specified health status: Secondary | ICD-10-CM

## 2017-08-28 DIAGNOSIS — Z7289 Other problems related to lifestyle: Secondary | ICD-10-CM

## 2017-08-28 LAB — COMPREHENSIVE METABOLIC PANEL
ALK PHOS: 59 U/L (ref 38–126)
ALT: 88 U/L — ABNORMAL HIGH (ref 17–63)
ANION GAP: 14 (ref 5–15)
AST: 124 U/L — ABNORMAL HIGH (ref 15–41)
Albumin: 4.7 g/dL (ref 3.5–5.0)
BILIRUBIN TOTAL: 0.5 mg/dL (ref 0.3–1.2)
BUN: 10 mg/dL (ref 6–20)
CALCIUM: 9 mg/dL (ref 8.9–10.3)
CO2: 23 mmol/L (ref 22–32)
CREATININE: 0.86 mg/dL (ref 0.61–1.24)
Chloride: 103 mmol/L (ref 101–111)
Glucose, Bld: 88 mg/dL (ref 65–99)
Potassium: 3.9 mmol/L (ref 3.5–5.1)
SODIUM: 140 mmol/L (ref 135–145)
TOTAL PROTEIN: 7.8 g/dL (ref 6.5–8.1)

## 2017-08-28 LAB — CBC
HCT: 45.8 % (ref 39.0–52.0)
HEMOGLOBIN: 15.9 g/dL (ref 13.0–17.0)
MCH: 33.3 pg (ref 26.0–34.0)
MCHC: 34.7 g/dL (ref 30.0–36.0)
MCV: 95.8 fL (ref 78.0–100.0)
PLATELETS: 261 10*3/uL (ref 150–400)
RBC: 4.78 MIL/uL (ref 4.22–5.81)
RDW: 14.1 % (ref 11.5–15.5)
WBC: 4.9 10*3/uL (ref 4.0–10.5)

## 2017-08-28 LAB — URINALYSIS, ROUTINE W REFLEX MICROSCOPIC
Bilirubin Urine: NEGATIVE
Glucose, UA: NEGATIVE mg/dL
Hgb urine dipstick: NEGATIVE
Ketones, ur: 5 mg/dL — AB
LEUKOCYTES UA: NEGATIVE
NITRITE: NEGATIVE
PROTEIN: NEGATIVE mg/dL
SPECIFIC GRAVITY, URINE: 1.018 (ref 1.005–1.030)
pH: 6 (ref 5.0–8.0)

## 2017-08-28 LAB — LIPASE, BLOOD: Lipase: 23 U/L (ref 11–51)

## 2017-08-28 MED ORDER — MORPHINE SULFATE (PF) 4 MG/ML IV SOLN
4.0000 mg | Freq: Once | INTRAVENOUS | Status: AC
  Administered 2017-08-28: 4 mg via INTRAVENOUS
  Filled 2017-08-28: qty 1

## 2017-08-28 MED ORDER — ONDANSETRON HCL 4 MG PO TABS: 4.0000 mg | ORAL_TABLET | Freq: Three times a day (TID) | ORAL | 0 refills | Status: DC | PRN

## 2017-08-28 MED ORDER — SODIUM CHLORIDE 0.9 % IV BOLUS (SEPSIS)
1000.0000 mL | Freq: Once | INTRAVENOUS | Status: AC
  Administered 2017-08-28: 1000 mL via INTRAVENOUS

## 2017-08-28 MED ORDER — ONDANSETRON HCL 4 MG/2ML IJ SOLN
4.0000 mg | Freq: Once | INTRAMUSCULAR | Status: AC | PRN
  Administered 2017-08-28: 4 mg via INTRAVENOUS
  Filled 2017-08-28: qty 2

## 2017-08-28 MED ORDER — GI COCKTAIL ~~LOC~~
30.0000 mL | Freq: Once | ORAL | Status: AC
  Administered 2017-08-28: 30 mL via ORAL
  Filled 2017-08-28: qty 30

## 2017-08-28 NOTE — ED Notes (Signed)
Called name to take to a room, but no response. Was told that he walked out in a group.

## 2017-08-28 NOTE — ED Triage Notes (Signed)
Patient is alert and oriented x4.  He is being seen for LUQ pain that he describes as pacreatitis that he was DX with last year.  Patient states that his pain is 10 of 10 with nausea and vomiting.

## 2017-08-28 NOTE — ED Provider Notes (Signed)
[facility]-EMERGENCY DEPT Provider Note   CSN: [id number] Arrival date & time: 08/28/17  0257     History   Chief Complaint Chief Complaint  Patient presents with  . Pancreatitis    HPI [name] is a 21 y.o. male.   The history is provided by the patient and medical records.  Abdominal Pain   This is a recurrent problem. The current episode started 12 to 24 hours ago. The problem occurs constantly. The problem has not changed since onset.The pain is associated with alcohol use. The pain is located in the LUQ and epigastric region. The quality of the pain is aching and sharp. The pain is at a severity of 10/10. The pain is severe. Associated symptoms include nausea, vomiting and dysuria. Pertinent negatives include fever, diarrhea, constipation, frequency and headaches. The symptoms are aggravated by palpation. Nothing relieves the symptoms.    Past Medical History:  Diagnosis Date  . Pancreatitis     Patient Active Problem List   Diagnosis Date Noted  . Acute pancreatitis 05/17/2017  . Hypokalemia 05/17/2017    History reviewed. No pertinent surgical history.     Home Medications    Prior to Admission medications   Medication Sig Start Date End Date Taking? Authorizing Provider  oxyCODONE (ROXICODONE) 5 MG immediate release tablet Take 1 tablet (5 mg total) by mouth every 4 (four) hours as needed for severe pain. 06/17/17   Molpus, Jonny RuizJohn, MD  promethazine (PHENERGAN) 25 MG tablet Take 1 tablet (25 mg total) by mouth every 6 (six) hours as needed for nausea or vomiting. 06/17/17   Molpus, Jonny RuizJohn, MD    Family History History reviewed. No pertinent family history.  Social History Social History  Substance Use Topics  . Smoking status: Former Smoker    Packs/day: 0.15    Types: Cigarettes  . Smokeless tobacco: Never Used  . Alcohol use Yes     Comment: daily      Allergies   Banana; Celery oil; and Pineapple   Review of Systems Review of Systems    Constitutional: Negative for chills, diaphoresis, fatigue and fever.  HENT: Negative for congestion.   Respiratory: Negative for cough, chest tightness and shortness of breath.   Cardiovascular: Negative for chest pain.  Gastrointestinal: Positive for abdominal pain, nausea and vomiting. Negative for constipation and diarrhea.  Genitourinary: Positive for dysuria. Negative for flank pain, frequency, penile pain and testicular pain.  Musculoskeletal: Negative for back pain, neck pain and neck stiffness.  Skin: Negative for rash and wound.  Neurological: Negative for light-headedness, numbness and headaches.  Psychiatric/Behavioral: Negative for agitation.  All other systems reviewed and are negative.    Physical Exam Updated Vital Signs BP 120/69 (BP Location: Left Arm)   Pulse 82   Temp 98.3 F (36.8 C) (Oral)   Resp 16   Ht 5\' 10"  (1.778 m)   Wt 74.8 kg (165 lb)   SpO2 100%   BMI 23.68 kg/m   Physical Exam  Constitutional: He is oriented to person, place, and time. He appears well-developed and well-nourished. No distress.  HENT:  Head: Normocephalic.  Mouth/Throat: Oropharynx is clear and moist. No oropharyngeal exudate.  Eyes: Pupils are equal, round, and reactive to light. Conjunctivae and EOM are normal.  Neck: Normal range of motion.  Cardiovascular: Normal rate.   No murmur heard. Pulmonary/Chest: Effort normal. No stridor. No respiratory distress. He has no wheezes. He exhibits no tenderness.  Abdominal: Normal appearance. There is tenderness in the epigastric area  and left upper quadrant. There is no rigidity, no rebound and no CVA tenderness.    Musculoskeletal: He exhibits no tenderness.  Neurological: He is alert and oriented to person, place, and time. No sensory deficit. He exhibits normal muscle tone.  Skin: Capillary refill takes less than 2 seconds. He is not diaphoretic. No erythema. No pallor.  Psychiatric: He has a normal mood and affect.  Nursing  note and vitals reviewed.    ED Treatments / Results  Labs (all labs ordered are listed, but only abnormal results are displayed) Labs Reviewed  COMPREHENSIVE METABOLIC PANEL - Abnormal; Notable for the following:       Result Value   AST 124 (*)    ALT 88 (*)    All other components within normal limits  URINALYSIS, ROUTINE W REFLEX MICROSCOPIC - Abnormal; Notable for the following:    Ketones, ur 5 (*)    All other components within normal limits  URINE CULTURE  LIPASE, BLOOD  CBC    EKG  EKG Interpretation None       Radiology No results found.  Procedures Procedures (including critical care time)  Medications Ordered in ED Medications  sodium chloride 0.9 % bolus 1,000 mL (not administered)  morphine 4 MG/ML injection 4 mg (not administered)  gi cocktail (Maalox,Lidocaine,Donnatal) (not administered)  ondansetron (ZOFRAN) injection 4 mg (4 mg Intravenous Given 08/28/17 0545)     Initial Impression / Assessment and Plan / ED Course  I have reviewed the triage vital signs and the nursing notes.  Pertinent labs & imaging results that were available during my care of the patient were reviewed by me and considered in my medical decision making (see chart for details).     Steven Mckenzie is a 21 y.o. male with a reported past medical history of pancreatitis who presents with left upper quadrant severe abdominal pain, nausea, and vomiting. Patient says that he went to a bar last night and drank a significant amount of alcohol. He is unsure how much she drank but said that overnight he began having left upper quadrant abdominal pain, nausea, and vomiting. He reports vomiting numerous times. He denies any blood in his emesis. He describes his current pain as 1010 severity. It is an aching and sharp pain. He denies any constipation or diarrhea but does report some dysuria today. He denies history of traumatic injuries. He denies any fevers, chills, chest pain, shortness of  breath, or headaches. He has not taken any medicine to help the symptoms.  Patient reports his nausea has felt better after some Zofran in triage.  On exam, patient has severe tenderness in the left upper quadrant and epigastrium. No tenderness in the lower abdomen or right upper quadrant. No CVA tenderness. Lungs clear.  Patient will have screening laboratory testing to look for severe pancreatitis as this is the likely cause of his symptoms. Alcoholic gastritis also considered. Patient was given pain medicine, fluids, and a GI cocktail. Patient will also have urinalysis in the setting of his dysuria.  Anticipate reassessment following medications and lab testing  8:25 AM Patient reports his pain and nausea have resolved. He is feeling much better and wants to go home. Patient's laboratory testing results are seen above. Patient's lipase not elevated. Liver function slightly elevated, will instruct patient to follow-up with PCP for reevaluation in the near future. Patient's urinalysis showed no evidence of UTI.  Patient will be given prescription for nausea medication and instructed to avoid alcohol. Patient also  instructed to stay hydrated. Patient given return precautions and follow-up instructions. Patient had no depressions or concerns and was discharged in good condition.   Final Clinical Impressions(s) / ED Diagnoses   Final diagnoses:  Left upper quadrant pain  LUQ abdominal pain  Alcohol use  Non-intractable vomiting with nausea, unspecified vomiting type    New Prescriptions New Prescriptions   ONDANSETRON (ZOFRAN) 4 MG TABLET    Take 1 tablet (4 mg total) by mouth every 8 (eight) hours as needed for nausea or vomiting.    Clinical Impression: 1. Left upper quadrant pain   2. LUQ abdominal pain   3. Alcohol use   4. Non-intractable vomiting with nausea, unspecified vomiting type     Disposition: Discharge  Condition: Good  I have discussed the results, Dx and Tx  plan with the pt(& family if present). He/she/they expressed understanding and agree(s) with the plan. Discharge instructions discussed at great length. Strict return precautions discussed and pt &/or family have verbalized understanding of the instructions. No further questions at time of discharge.    New Prescriptions   ONDANSETRON (ZOFRAN) 4 MG TABLET    Take 1 tablet (4 mg total) by mouth every 8 (eight) hours as needed for nausea or vomiting.    Follow Up: [facility] [address] [place] [address] [phone] Schedule an appointment as soon as possible for a visit    [facility]-EMERGENC[facility] DEPT [address] [phone] mc [place] [address] [phone]  If symptoms worsen     Chayson Charters, Canary Brim, MD 08/29/17 1408

## 2017-08-28 NOTE — ED Notes (Signed)
Patient admits to being "Drunk"

## 2017-08-28 NOTE — Discharge Instructions (Signed)
Our diagnostic testing results were reassuring and we did not see evidence of infection or severe pancreatitis. We suspect your abdominal pain was due to the alcohol use last night. Please stay hydrated and avoid alcohol given your pancreatitis history. Please follow-up with your primary care physician for further evaluation and management and to have your liver function rechecked. If any symptoms change or worsen, please return to the nearest emergency department.

## 2017-08-28 NOTE — ED Notes (Signed)
Patient did not respond when trying to obtain V/S in the lobby.  Patient did not inform staff of leaving the facility.  

## 2017-08-28 NOTE — ED Notes (Signed)
Bed: [facility] Expected date:  Expected time:  Means of arrival:  Comments: 

## 2017-08-29 LAB — URINE CULTURE: Culture: NO GROWTH

## 2017-09-27 ENCOUNTER — Emergency Department (HOSPITAL_COMMUNITY)
Admission: EM | Admit: 2017-09-27 | Discharge: 2017-09-27 | Discharge status: Against Medical Advice | Payer: BLUE CROSS/BLUE SHIELD | Attending: Emergency Medicine | Admitting: Emergency Medicine

## 2017-09-27 ENCOUNTER — Encounter (HOSPITAL_COMMUNITY): Payer: Self-pay | Admitting: Emergency Medicine

## 2017-09-27 DIAGNOSIS — Z87891 Personal history of nicotine dependence: Secondary | ICD-10-CM | POA: Insufficient documentation

## 2017-09-27 DIAGNOSIS — F10929 Alcohol use, unspecified with intoxication, unspecified: Secondary | ICD-10-CM | POA: Insufficient documentation

## 2017-09-27 DIAGNOSIS — R1013 Epigastric pain: Secondary | ICD-10-CM | POA: Diagnosis not present

## 2017-09-27 LAB — URINALYSIS, ROUTINE W REFLEX MICROSCOPIC
Bilirubin Urine: NEGATIVE
GLUCOSE, UA: NEGATIVE mg/dL
HGB URINE DIPSTICK: NEGATIVE
Ketones, ur: 5 mg/dL — AB
LEUKOCYTES UA: NEGATIVE
Nitrite: NEGATIVE
PH: 6 (ref 5.0–8.0)
PROTEIN: NEGATIVE mg/dL
SPECIFIC GRAVITY, URINE: 1.018 (ref 1.005–1.030)

## 2017-09-27 LAB — ETHANOL: ALCOHOL ETHYL (B): 179 mg/dL — AB (ref <10)

## 2017-09-27 MED ORDER — ONDANSETRON HCL 4 MG/2ML IJ SOLN
4.0000 mg | Freq: Once | INTRAMUSCULAR | Status: AC
  Administered 2017-09-27: 4 mg via INTRAVENOUS
  Filled 2017-09-27: qty 2

## 2017-09-27 MED ORDER — FENTANYL CITRATE (PF) 100 MCG/2ML IJ SOLN
100.0000 ug | INTRAMUSCULAR | Status: DC | PRN
  Administered 2017-09-27: 100 ug via INTRAVENOUS
  Filled 2017-09-27: qty 2

## 2017-09-27 MED ORDER — SODIUM CHLORIDE 0.9 % IV BOLUS (SEPSIS)
1000.0000 mL | Freq: Once | INTRAVENOUS | Status: AC
Start: 2017-09-27 — End: 2017-09-27
  Administered 2017-09-27: 1000 mL via INTRAVENOUS

## 2017-09-27 NOTE — ED Notes (Signed)
ED Provider at bedside. 

## 2017-09-27 NOTE — ED Notes (Signed)
Labs drawn and sent to lab, no protocol orders placed.

## 2017-09-27 NOTE — ED Triage Notes (Signed)
Pt reports ETOH last night, reports waking this am feeling "strange," states, "I feel all discombobulated."  Pt reports tremor in RUE, reports sometimes tremor in in RLE.  No focal deficit noted, VAN negative.

## 2017-09-27 NOTE — ED Notes (Signed)
Pt took disconnected from fluids and walked out of department, stopped in ambulance bay by security. Pt taken back to hall bed stating that he is upset that he does not have a room and "Yall aint doing nothing for me".

## 2017-09-27 NOTE — ED Provider Notes (Signed)
[facility]-EMERGENCY DEPT Provider Note   CSN: [id number] Arrival date & time: 09/27/17  1245     History   Chief Complaint Chief Complaint  Patient presents with  . Tremors  . Abdominal Pain    HPI Steven Mckenzie is a 21 y.o. male.  The patient presents for evaluation of tingling and upper abdominal pain, both of which started this morning.  Patient feels like he might have pancreatitis, possibly stimulated by taking cough medicine yesterday.  He states he has had pancreatitis at least 6 different times.  He denies fever, cough, weakness or dizziness.  He has had multiple episodes of vomiting today, but no diarrhea.  There are no other known modifying factors.  HPI  Past Medical History:  Diagnosis Date  . Pancreatitis     Patient Active Problem List   Diagnosis Date Noted  . Acute pancreatitis 05/17/2017  . Hypokalemia 05/17/2017    History reviewed. No pertinent surgical history.     Home Medications    Prior to Admission medications   Not on File    Family History No family history on file.  Social History Social History  Substance Use Topics  . Smoking status: Former Smoker    Packs/day: 0.15    Types: Cigarettes  . Smokeless tobacco: Never Used  . Alcohol use Yes     Comment: daily      Allergies   Banana; Celery oil; and Pineapple   Review of Systems Review of Systems  All other systems reviewed and are negative.    Physical Exam Updated Vital Signs BP (!) 136/108 (BP Location: Right Arm)   Pulse (!) 116   Temp 98.6 F (37 C) (Oral)   Resp 16   Ht  (1.778 m)   Wt 72.6 kg (160 lb)   SpO2 98%   BMI 22.96 kg/m   Physical Exam  Constitutional: He is oriented to person, place, and time. He appears well-developed and well-nourished. No distress.  HENT:  Head: Normocephalic and atraumatic.  Right Ear: External ear normal.  Left Ear: External ear normal.  Eyes: Pupils are equal, round, and reactive to light. Conjunctivae and EOM  are normal.  Neck: Normal range of motion and phonation normal. Neck supple.  Cardiovascular: Normal rate, regular rhythm and normal heart sounds.   Pulmonary/Chest: Effort normal and breath sounds normal. He exhibits no bony tenderness.  Abdominal: Soft. There is no tenderness (Epigastric, moderate).  Musculoskeletal: Normal range of motion.  Neurological: He is alert and oriented to person, place, and time. No cranial nerve deficit or sensory deficit. He exhibits normal muscle tone. Coordination normal.  Skin: Skin is warm, dry and intact.  Psychiatric: He has a normal mood and affect. His behavior is normal. Judgment and thought content normal.  Nursing note and vitals reviewed.    ED Treatments / Results  Labs (all labs ordered are listed, but only abnormal results are displayed) Labs Reviewed  URINALYSIS, ROUTINE W REFLEX MICROSCOPIC - Abnormal; Notable for the following:       Result Value   Ketones, ur 5 (*)    All other components within normal limits  ETHANOL - Abnormal; Notable for the following:    Alcohol, Ethyl (B) 179 (*)    All other components within normal limits  LIPASE, BLOOD  COMPREHENSIVE METABOLIC PANEL  CBC  RAPID URINE DRUG SCREEN, HOSP PERFORMED    EKG  EKG Interpretation None       Radiology No results found.  Procedures  Procedures (including critical care time)  Medications Ordered in ED Medications  fentaNYL (SUBLIMAZE) injection 100 mcg (100 mcg Intravenous Given 09/27/17 1756)  sodium chloride 0.9 % bolus 1,000 mL (1,000 mLs Intravenous New Bag/Given 09/27/17 1757)  ondansetron (ZOFRAN) injection 4 mg (4 mg Intravenous Given 09/27/17 1756)     Initial Impression / Assessment and Plan / ED Course  I have reviewed the triage vital signs and the nursing notes.  Pertinent labs & imaging results that were available during my care of the patient were reviewed by me and considered in my medical decision making (see chart for  details).  Clinical Course as of Sep 27 1848  Tue Sep 27, 2017  1729 Patient requested water and was given ice water to drink, as a p.o. challenge  [EW]  1846 The patient disconnected his IV,, then pulled out the catheter, causing fluid and blood to go on the floor.  He told nursing that he wanted to leave and did not want any further treatment.  He is made AMA status.  [EW]    Clinical Course User Index [EW] Mancel Bale, MD     Patient Vitals for the past 24 hrs:  BP Temp Temp src Pulse Resp SpO2 Height Weight  09/27/17 1257 (!) 136/108 98.6 F (37 C) Oral (!) 116 16 98 %  (1.778 m) 72.6 kg (160 lb)         Final Clinical Impressions(s) / ED Diagnoses   Final diagnoses:  Epigastric pain  Alcoholic intoxication with complication (HCC)    Nonspecific abdominal pain.  Hypertension, associated with an acute painful condition.  The patient is intoxicated with alcohol.  The patient left AGAINST MEDICAL ADVICE, prior to completion of evaluation and treatment.  Nursing Notes Reviewed/ Care Coordinated Applicable Imaging Reviewed Interpretation of Laboratory Data incorporated into ED treatment  Disposition-AMA  New Prescriptions New Prescriptions   No medications on file     Mancel Bale, MD 09/27/17 [phone]

## 2017-09-27 NOTE — ED Notes (Signed)
Pt reports he feels like he is going to pass out if he does not get some water or ice. Explained to pt that he needs sit back on the bed if he feels that he is going to pass out. Pt refused. EDP made aware of pt request for water.

## 2017-09-27 NOTE — ED Notes (Signed)
Patient upset about being in pain and the wait time. Patient states is not concerned about his tremors but is presenting to ED for abdominal pain, nausea and vomiting that started a few hours after patient was drinking ETOH. Pt has hx of pancreatitis and states feels same. Patient states did not mention this to triage nurse because he thought he would be taken back soon. Protocol orders placed

## 2017-09-27 NOTE — ED Notes (Signed)
Pt taken back to hall bad when he got up again and attemmpted to walk down hall. Stopped again by security and explained he could not leave with IV in place. Pt pulled IV out while standing in hall way. Gauze applied by RN. Pt asked if he would like to sit back down so we could help him and he stated "No Im leaving". Explained to pt he would be leaving against medical advice. Pt states "I dont care".

## 2017-09-27 NOTE — ED Notes (Signed)
Patient's mom has called again, would like an updated from "a nurse."  Her name/number:  Hedda Slade @ [phone]

## 2017-09-27 NOTE — ED Notes (Signed)
Pt reprots this feels like the last time he had pancreatitis. HE stats he drank too much last night but would not state what he drank or the amount. Pt also reports taking cough medication last night. Denies taking any medications today.

## 2017-09-27 NOTE — ED Notes (Signed)
Gave pt water per MD, pt vomited water.

## 2017-09-30 ENCOUNTER — Emergency Department (HOSPITAL_COMMUNITY): Payer: BLUE CROSS/BLUE SHIELD

## 2017-09-30 ENCOUNTER — Ambulatory Visit (HOSPITAL_COMMUNITY)
Admission: RE | Admit: 2017-09-30 | Discharge: 2017-09-30 | Disposition: A | Discharge status: Home / Self Care | Payer: BLUE CROSS/BLUE SHIELD | Source: Home / Self Care | Attending: Psychiatry | Admitting: Psychiatry

## 2017-09-30 ENCOUNTER — Inpatient Hospital Stay (HOSPITAL_COMMUNITY)
Admission: EM | Admit: 2017-09-30 | Discharge: 2017-10-05 | DRG: 897 | Disposition: A | Discharge status: Home / Self Care | Payer: BLUE CROSS/BLUE SHIELD | Attending: Internal Medicine | Admitting: Internal Medicine

## 2017-09-30 ENCOUNTER — Encounter (HOSPITAL_COMMUNITY): Payer: Self-pay | Admitting: *Deleted

## 2017-09-30 DIAGNOSIS — F1210 Cannabis abuse, uncomplicated: Secondary | ICD-10-CM | POA: Diagnosis present

## 2017-09-30 DIAGNOSIS — R440 Auditory hallucinations: Secondary | ICD-10-CM | POA: Diagnosis present

## 2017-09-30 DIAGNOSIS — K8689 Other specified diseases of pancreas: Secondary | ICD-10-CM

## 2017-09-30 DIAGNOSIS — F17210 Nicotine dependence, cigarettes, uncomplicated: Secondary | ICD-10-CM | POA: Diagnosis present

## 2017-09-30 DIAGNOSIS — F10230 Alcohol dependence with withdrawal, uncomplicated: Secondary | ICD-10-CM | POA: Diagnosis not present

## 2017-09-30 DIAGNOSIS — F1010 Alcohol abuse, uncomplicated: Secondary | ICD-10-CM | POA: Diagnosis present

## 2017-09-30 DIAGNOSIS — R109 Unspecified abdominal pain: Secondary | ICD-10-CM | POA: Diagnosis not present

## 2017-09-30 DIAGNOSIS — Z885 Allergy status to narcotic agent status: Secondary | ICD-10-CM

## 2017-09-30 DIAGNOSIS — F10939 Alcohol use, unspecified with withdrawal, unspecified: Secondary | ICD-10-CM | POA: Diagnosis present

## 2017-09-30 DIAGNOSIS — K8681 Exocrine pancreatic insufficiency: Secondary | ICD-10-CM | POA: Diagnosis present

## 2017-09-30 DIAGNOSIS — K860 Alcohol-induced chronic pancreatitis: Secondary | ICD-10-CM | POA: Diagnosis not present

## 2017-09-30 DIAGNOSIS — F10931 Alcohol use, unspecified with withdrawal delirium: Secondary | ICD-10-CM | POA: Diagnosis present

## 2017-09-30 DIAGNOSIS — Y906 Blood alcohol level of 120-199 mg/100 ml: Secondary | ICD-10-CM | POA: Diagnosis present

## 2017-09-30 DIAGNOSIS — K861 Other chronic pancreatitis: Secondary | ICD-10-CM | POA: Diagnosis present

## 2017-09-30 DIAGNOSIS — Z720 Tobacco use: Secondary | ICD-10-CM | POA: Diagnosis not present

## 2017-09-30 DIAGNOSIS — K863 Pseudocyst of pancreas: Secondary | ICD-10-CM | POA: Diagnosis present

## 2017-09-30 DIAGNOSIS — Z811 Family history of alcohol abuse and dependence: Secondary | ICD-10-CM

## 2017-09-30 DIAGNOSIS — D649 Anemia, unspecified: Secondary | ICD-10-CM | POA: Diagnosis present

## 2017-09-30 DIAGNOSIS — K859 Acute pancreatitis without necrosis or infection, unspecified: Secondary | ICD-10-CM | POA: Diagnosis present

## 2017-09-30 DIAGNOSIS — F10229 Alcohol dependence with intoxication, unspecified: Secondary | ICD-10-CM | POA: Diagnosis not present

## 2017-09-30 DIAGNOSIS — F10239 Alcohol dependence with withdrawal, unspecified: Secondary | ICD-10-CM | POA: Diagnosis present

## 2017-09-30 DIAGNOSIS — F10231 Alcohol dependence with withdrawal delirium: Secondary | ICD-10-CM | POA: Diagnosis present

## 2017-09-30 DIAGNOSIS — Z91018 Allergy to other foods: Secondary | ICD-10-CM | POA: Diagnosis not present

## 2017-09-30 DIAGNOSIS — E876 Hypokalemia: Secondary | ICD-10-CM | POA: Diagnosis not present

## 2017-09-30 LAB — ETHANOL: Alcohol, Ethyl (B): <10 mg/dL (ref <10)

## 2017-09-30 LAB — COMPREHENSIVE METABOLIC PANEL
ALBUMIN: 4.9 g/dL (ref 3.5–5.0)
ALT: 41 U/L (ref 17–63)
ANION GAP: 15 (ref 5–15)
AST: 79 U/L — AB (ref 15–41)
Alkaline Phosphatase: 65 U/L (ref 38–126)
BUN: 13 mg/dL (ref 6–20)
CHLORIDE: 101 mmol/L (ref 101–111)
CO2: 22 mmol/L (ref 22–32)
Calcium: 9.9 mg/dL (ref 8.9–10.3)
Creatinine, Ser: 0.81 mg/dL (ref 0.61–1.24)
GFR calc Af Amer: >60 mL/min (ref >60)
GFR calc non Af Amer: >60 mL/min (ref >60)
GLUCOSE: 78 mg/dL (ref 65–99)
POTASSIUM: 4.1 mmol/L (ref 3.5–5.1)
SODIUM: 138 mmol/L (ref 135–145)
TOTAL PROTEIN: 7.9 g/dL (ref 6.5–8.1)
Total Bilirubin: 1.8 mg/dL — ABNORMAL HIGH (ref 0.3–1.2)

## 2017-09-30 LAB — RAPID URINE DRUG SCREEN, HOSP PERFORMED
AMPHETAMINES: NOT DETECTED
BARBITURATES: NOT DETECTED
Benzodiazepines: NOT DETECTED
COCAINE: NOT DETECTED
OPIATES: NOT DETECTED
TETRAHYDROCANNABINOL: POSITIVE — AB

## 2017-09-30 LAB — LIPASE, BLOOD: LIPASE: 18 U/L (ref 11–51)

## 2017-09-30 LAB — URINALYSIS, ROUTINE W REFLEX MICROSCOPIC
Bilirubin Urine: NEGATIVE
GLUCOSE, UA: NEGATIVE mg/dL
Hgb urine dipstick: NEGATIVE
Ketones, ur: 20 mg/dL — AB
Leukocytes, UA: NEGATIVE
NITRITE: NEGATIVE
PH: 5 (ref 5.0–8.0)
Protein, ur: NEGATIVE mg/dL
SPECIFIC GRAVITY, URINE: 1.02 (ref 1.005–1.030)

## 2017-09-30 LAB — CBC
HEMATOCRIT: 42.4 % (ref 39.0–52.0)
HEMOGLOBIN: 14.7 g/dL (ref 13.0–17.0)
MCH: 33.3 pg (ref 26.0–34.0)
MCHC: 34.7 g/dL (ref 30.0–36.0)
MCV: 96.1 fL (ref 78.0–100.0)
Platelets: 288 10*3/uL (ref 150–400)
RBC: 4.41 MIL/uL (ref 4.22–5.81)
RDW: 13.6 % (ref 11.5–15.5)
WBC: 7.9 10*3/uL (ref 4.0–10.5)

## 2017-09-30 MED ORDER — LORAZEPAM 2 MG/ML IJ SOLN
2.0000 mg | INTRAMUSCULAR | Status: DC | PRN
  Administered 2017-09-30: 2 mg via INTRAVENOUS
  Administered 2017-10-01: 1 mg via INTRAVENOUS
  Administered 2017-10-01 – 2017-10-03 (×7): 2 mg via INTRAVENOUS
  Filled 2017-09-30 (×8): qty 1

## 2017-09-30 MED ORDER — HYDROMORPHONE HCL 1 MG/ML IJ SOLN
0.5000 mg | Freq: Once | INTRAMUSCULAR | Status: AC
  Administered 2017-09-30: 0.5 mg via INTRAVENOUS
  Filled 2017-09-30: qty 1

## 2017-09-30 MED ORDER — THIAMINE HCL 100 MG/ML IJ SOLN
Freq: Once | INTRAVENOUS | Status: AC
  Administered 2017-09-30: 20:00:00 via INTRAVENOUS
  Filled 2017-09-30: qty 1000

## 2017-09-30 MED ORDER — IOPAMIDOL (ISOVUE-300) INJECTION 61%
INTRAVENOUS | Status: AC
  Filled 2017-09-30: qty 100

## 2017-09-30 MED ORDER — SODIUM CHLORIDE 0.9 % IV BOLUS (SEPSIS)
2000.0000 mL | Freq: Once | INTRAVENOUS | Status: AC
  Administered 2017-09-30: 2000 mL via INTRAVENOUS

## 2017-09-30 MED ORDER — IOPAMIDOL (ISOVUE-300) INJECTION 61%
100.0000 mL | Freq: Once | INTRAVENOUS | Status: AC | PRN
  Administered 2017-09-30: 100 mL via INTRAVENOUS

## 2017-09-30 NOTE — ED Triage Notes (Signed)
Pt sent here from [facility] for alcohol withdrawal and left sided abdominal pain. Pt last vomited this morning before taking zofran, which he states helped. Pt denies diarrhea. Pt reports auditory hallucinations. Pt states his pain is better when crouching forward.

## 2017-09-30 NOTE — ED Provider Notes (Signed)
[facility]-EMERGENCY DEPT Provider Note   CSN: [id number] Arrival date & time: 09/30/17  1653     History   Chief Complaint Chief Complaint  Patient presents with  . Alcohol Problem  . Abdominal Pain    HPI Steven Mckenzie is a 21 y.o. male.Complains of severe diffuse abdominal pain like pancreatitis she's had in the past. Patient also feels that he is withdrawing from alcohol and has vomited multiple times over the past 2 days. His last alcoholic drink was 2 days ago. He reports hearing somebody knocking his door and somebody mentioned his name 2 days ago but states "I was drunk at the time". No further hallucinations.. No other associated symptoms. He treated himself with both Zofran, however continued to vomit. [facility] for alcohol withdrawal and was sent here for further evaluation. He wishes to have help with his alcohol problem  HPI  Past Medical History:  Diagnosis Date  . Pancreatitis     Patient Active Problem List   Diagnosis Date Noted  . Acute pancreatitis 05/17/2017  . Hypokalemia 05/17/2017    History reviewed. No pertinent surgical history.     Home Medications    Prior to Admission medications   Not on File    Family History No family history on file.  Social History Social History  Substance Use Topics  . Smoking status: Former Smoker    Packs/day: 0.15    Types: Cigarettes  . Smokeless tobacco: Never Used  . Alcohol use Yes     Comment: daily    Positive marijuana use  Allergies   Banana; Celery oil; and Pineapple   Review of Systems Review of Systems  Gastrointestinal: Positive for abdominal pain and vomiting.  Neurological: Positive for tremors.  All other systems reviewed and are negative.    Physical Exam Updated Vital Signs BP 134/78   Pulse 86   Temp 98.9 F (37.2 C) (Oral)   Resp 19   Ht  (1.778 m)   Wt 67.1 kg (148 lb)   SpO2 98%   BMI 21.24 kg/m   Physical Exam    Constitutional: He is oriented to person, place, and time. He appears well-developed and well-nourished. He appears distressed.  Ill-appearing, appears uncomfortable  HENT:  Head: Normocephalic and atraumatic.  Eyes: Pupils are equal, round, and reactive to light. Conjunctivae are normal.  Neck: Neck supple. No tracheal deviation present. No thyromegaly present.  Cardiovascular: Normal rate and regular rhythm.   No murmur heard. Pulmonary/Chest: Effort normal and breath sounds normal.  Abdominal: Soft. Bowel sounds are normal. He exhibits no distension. There is tenderness.  Diffusely tender  Genitourinary: Penis normal.  Genitourinary Comments: Normal male genitalia  Musculoskeletal: Normal range of motion. He exhibits no edema or tenderness.  Neurological: He is alert and oriented to person, place, and time. Coordination normal.  Tremulous  Skin: Skin is warm and dry. No rash noted.  Mildly diaphoretic  Psychiatric: He has a normal mood and affect.  Nursing note and vitals reviewed.    ED Treatments / Results  Labs (all labs ordered are listed, but only abnormal results are displayed) Labs Reviewed  LIPASE, BLOOD  COMPREHENSIVE METABOLIC PANEL  CBC  URINALYSIS, ROUTINE W REFLEX MICROSCOPIC  ETHANOL  RAPID URINE DRUG SCREEN, HOSP PERFORMED    EKG  EKG Interpretation None       Radiology No results found.  Procedures Procedures (including critical care time)  Medications Ordered in ED Medications  sodium chloride 0.9 % bolus 2,000 mL (not administered)  HYDROmorphone (DILAUDID) injection 0.5 mg (not administered)  LORazepam (ATIVAN) injection 2-3 mg (not administered)  sodium chloride 0.9 % 1,000 mL with thiamine 100 mg, folic acid 1 mg, multivitamins adult 10 mL infusion (not administered)    Results for orders placed or performed during the hospital encounter of 09/30/17  Lipase, blood  Result Value Ref Range   Lipase 18 11 - 51 U/L  Comprehensive  metabolic panel  Result Value Ref Range   Sodium 138 135 - 145 mmol/L   Potassium 4.1 3.5 - 5.1 mmol/L   Chloride 101 101 - 111 mmol/L   CO2 22 22 - 32 mmol/L   Glucose, Bld 78 65 - 99 mg/dL   BUN 13 6 - 20 mg/dL   Creatinine, Ser 1.61 0.61 - 1.24 mg/dL   Calcium 9.9 8.9 - 09.6 mg/dL   Total Protein 7.9 6.5 - 8.1 g/dL   Albumin 4.9 3.5 - 5.0 g/dL   AST 79 (H) 15 - 41 U/L   ALT 41 17 - 63 U/L   Alkaline Phosphatase 65 38 - 126 U/L   Total Bilirubin 1.8 (H) 0.3 - 1.2 mg/dL   GFR calc non Af Amer >60 >60 mL/min   GFR calc Af Amer >60 >60 mL/min   Anion gap 15 5 - 15  CBC  Result Value Ref Range   WBC 7.9 4.0 - 10.5 K/uL   RBC 4.41 4.22 - 5.81 MIL/uL   Hemoglobin 14.7 13.0 - 17.0 g/dL   HCT 04.5 40.9 - 81.1 %   MCV 96.1 78.0 - 100.0 fL   MCH 33.3 26.0 - 34.0 pg   MCHC 34.7 30.0 - 36.0 g/dL   RDW 91.4 78.2 - 95.6 %   Platelets 288 150 - 400 K/uL  Urinalysis, Routine w reflex microscopic  Result Value Ref Range   Color, Urine YELLOW YELLOW   APPearance CLEAR CLEAR   Specific Gravity, Urine 1.020 1.005 - 1.030   pH 5.0 5.0 - 8.0   Glucose, UA NEGATIVE NEGATIVE mg/dL   Hgb urine dipstick NEGATIVE NEGATIVE   Bilirubin Urine NEGATIVE NEGATIVE   Ketones, ur 20 (A) NEGATIVE mg/dL   Protein, ur NEGATIVE NEGATIVE mg/dL   Nitrite NEGATIVE NEGATIVE   Leukocytes, UA NEGATIVE NEGATIVE  Ethanol  Result Value Ref Range   Alcohol, Ethyl (B) <10 <10 mg/dL  Rapid urine drug screen (hospital performed)  Result Value Ref Range   Opiates NONE DETECTED NONE DETECTED   Cocaine NONE DETECTED NONE DETECTED   Benzodiazepines NONE DETECTED NONE DETECTED   Amphetamines NONE DETECTED NONE DETECTED   Tetrahydrocannabinol POSITIVE (A) NONE DETECTED   Barbiturates NONE DETECTED NONE DETECTED   Ct Abdomen Pelvis W Contrast  Result Date: 09/30/2017 CLINICAL DATA:  21 y/o M; left-sided abdominal pain since yesterday. History of pancreatitis. EXAM: CT ABDOMEN AND PELVIS WITH CONTRAST TECHNIQUE:  Multidetector CT imaging of the abdomen and pelvis was performed using the standard protocol following bolus administration of intravenous contrast. CONTRAST:  ISOVUE-300 IOPAMIDOL (ISOVUE-300) INJECTION 61% COMPARISON:  None. FINDINGS: Lower chest: No acute abnormality. Hepatobiliary: No focal liver abnormality is seen. No gallstones, gallbladder wall thickening, or biliary dilatation. Pancreas: Low-attenuation well-circumscribed lesion arising from the tail of the pancreas extending into the left pre renal space measuring 16 x 36 x 18 mm (AP x ML x CC series 2, image 22 and series 4, image 55). No pancreatic ductal dilatation. No appreciable peripancreatic inflammation. Spleen:  Normal in size without focal abnormality. Adrenals/Urinary Tract: Adrenal glands are unremarkable. Kidneys are normal, without renal calculi, focal lesion, or hydronephrosis. Bladder is unremarkable. Stomach/Bowel: Mild wall thickening of the upper descending colon and fat stranding within the left pericolic gutter may represent a mild acute colitis. Otherwise there is no obstructive or inflammatory changes of the large and small bowel. The stomach is unremarkable. Normal appendix. Vascular/Lymphatic: No significant vascular findings are present. No enlarged abdominal or pelvic lymph nodes. Reproductive: Prostate is unremarkable. Other: No abdominal wall hernia or abnormality. No abdominopelvic ascites. Musculoskeletal: No acute or significant osseous findings. IMPRESSION: 1. Low attenuating well-circumscribed lesion arising from the tail of pancreas extending into left pre renal space measuring up to 36 mm. Differential includes cystic pancreatic neoplasm and pseudocyst given history of acute pancreatitis. Further evaluation with MRI/MRCP with and without contrast is recommended on a nonemergent basis. 2. Mild wall thickening of the upper descending colon and edema within left pericolic gutter. Findings may represent mild infectious  colitis or possibly reactive inflammation related to the adjacent pancreatic tail lesion. Electronically Signed   By: Mitzi Hansen M.D.   On: 09/30/2017 21:15   Initial Impression / Assessment and Plan / ED Course  I have reviewed the triage vital signs and the nursing notes.  Pertinent labs & imaging results that were available during my care of the patient were reviewed by me and considered in my medical decision making (see chart for details).   CIWA score calculated at 10 by me  8 PM patient states pain is improved and he is much less tremulous after treatment with intravenous Ativan and hydromorphone and intravenous fluids.Peggye Form consulted hospitalist Dr.Doutova who will arrange for admission Final Clinical Impressions(s) / ED Diagnoses  Diagnosis #1 acute alcoholic pancreatitis #2 acute alcohol withdrawal #3 vomiting Final diagnoses:  None  CRITICAL CARE Performed by: Doug Sou Total critical care time: 30 minutes Critical care time was exclusive of separately billable procedures and treating other patients. Critical care was necessary to treat or prevent imminent or life-threatening deterioration. Critical care was time spent personally by me on the following activities: development of treatment plan with patient and/or surrogate as well as nursing, discussions with consultants, evaluation of patient's response to treatment, examination of patient, obtaining history from patient or surrogate, ordering and performing treatments and interventions, ordering and review of laboratory studies, ordering and review of radiographic studies, pulse oximetry and re-evaluation of patient's condition. New Prescriptions New Prescriptions   No medications on file     Doug Sou, MD 09/30/17 2250
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
## 2017-09-30 NOTE — H&P (Signed)
Behavioral Health Medical Screening Exam  Steven Mckenzie is an 21 y.o. male.  Total Time spent with patient: 45 minutes  Psychiatric Specialty Exam: Physical Exam  Neck: Normal range of motion.  Cardiovascular: Normal rate.   Respiratory: Effort normal.  Musculoskeletal: Normal range of motion.  Psychiatric: Thought content normal. His mood appears anxious. He is actively hallucinating (auditory). Cognition and memory are normal. He expresses impulsivity.    Review of Systems  Constitutional: Positive for diaphoresis.  Gastrointestinal: Positive for abdominal pain and nausea.  Neurological: Positive for tremors. Seizures: Unsure but thinks he has had a seizure related to alcohol before.  Psychiatric/Behavioral: Positive for hallucinations (auditory). Depression: Denies. Substance abuse: Alcohol abuse; states that he drinks 3/5 of liquor daily; and has been drinking since the age or 17. Suicidal ideas: Denies. The patient is nervous/anxious.   All other systems reviewed and are negative.   Blood pressure (!) 151/85, pulse 96, temperature 99.5 F (37.5 C), temperature source Oral, SpO2 100 %.There is no height or weight on file to calculate BMI.  General Appearance: Casual and Fairly Groomed  Eye Contact:  Good  Speech:  Clear and Coherent and Normal Rate  Volume:  Normal  Mood:  Anxious  Affect:  Congruent  Thought Process:  Coherent and Goal Directed  Orientation:  Full (Time, Place, and Person)  Thought Content:  Logical  Suicidal Thoughts:  No  Homicidal Thoughts:  No  Memory:  Immediate;   Good Recent;   Good Remote;   Good  Judgement:  Fair  Insight:  Fair  Psychomotor Activity:  Restlessness and Tremor  Concentration: Concentration: Good and Attention Span: Good  Recall:  Good  Fund of Knowledge:Fair  Language: Good  Akathisia:  No  Handed:  Right  AIMS (if indicated):     Assets:  Communication Skills Desire for Improvement Housing Social Support  Sleep:        Musculoskeletal: Strength & Muscle Tone: within normal limits Gait & Station: normal Patient leans: N/A   Assessment:  Patient presents as walk in with complaints that he has been trying to stop drinking "but the shakes have gotten bad and can't stop and now feeling sick."  Patient reports that he has been drinking daily since the age of 50.  States that when he doesn't drink gets the shakes.  Reports that he wanted to change and stop drinking but not the shakes are really bad and can't stop; and that he is also feeling funny.  Reports history of alcohol blackouts, and possible seizure but unsure has blacked out with pain in neck and head, feeling his body stiffen but doesn't know what a seizure is. Patient also states that he is hearing auditory hallucinations.     Blood pressure (!) 151/85, pulse 96, temperature 99.5 F (37.5 C), temperature source Oral, SpO2 100 %.  Recommendations:  Inpatient psychiatric treatment when medically cleared  Sending patient to [facility] related to alcohol withdrawal; auditory hallucinations, and possible history of alcohol seizure:  Rule out DT's  Based on my evaluation the patient appears to have an emergency medical condition for which I recommend the patient be transferred to the emergency department for further evaluation.  Polly Barner, NP 09/30/2017, 4:56 PM

## 2017-09-30 NOTE — H&P (Signed)
Steven Mckenzie WJX:[id number] DOB: 1996/06/30 DOA: 09/30/2017     PCP: Patient, No Pcp Per   Outpatient Specialists: none  Patient coming from:   home Lives alone,      Chief Complaint: Wants to quit drinking  HPI: Steven Mckenzie is a 21 y.o. male with medical history significant of alcohol abuse and pancreatitis currently student at [employer]    Today he presented stating that he would like to quit drinking mostly ovaries sake and start her severe shakes states she's been drinking daily since age 58 he was evaluated by behavioral health and was noted to have active hallucinations at that time. Given episodes of acute withdrawal patient was transferred to ER. At this point patient denies hearing voices. He have to try to control his nausea and vomiting with Zofran but did not seem to help continues to have abdominal pain and tremors in emergency department CIWA score 10 protocol initiated Reports he drinks over a 5th of liquor a day.  Denies any fever, LUQ and epigastric pain   On October 2 patient was seen in the emergency department with tremors and abdominal pain likely early pancreatitis. His alcohol level was 179 he was given sent to the fluids and Zofran and then left AMA.  Regarding pertinent Chronic problems: Recurrent episodes of pancreatitis secondary to heavy alcohol abuse last admission for the same in May 2018 Evaluated for another episode of alcohol abuse/pancreatitis and September in emergency department Reports several have had pancreatitis at least 6 times IN ER:  Temp (24hrs), Avg:98.9 F (37.2 C), Min:98.9 F (37.2 C), Max:98.9 F (37.2 C)      on arrival  ED Triage Vitals  Enc Vitals Group     BP 09/30/17 1754 134/78     Pulse Rate 09/30/17 1754 86     Resp 09/30/17 1754 19     Temp 09/30/17 1754 98.9 F (37.2 C)     Temp Source 09/30/17 1754 Oral     SpO2 09/30/17 1754 98 %     Weight 09/30/17 1754 148 lb (67.1 kg)     Height 09/30/17 1754  (1.778 m)       Head Circumference --      Peak Flow --      Pain Score 09/30/17 1846 10     Pain Loc --      Pain Edu? --      Excl. in GC? --     Latest RR 19 100% HR 88 BP 128/74 Urine tox + for THC Na 138 K 4.1Cr 0.81 WBC 7.9 Hg 14.7 plt 288 EtOH <10 Lipase 18 CT abd/pelvis possible pseudocyst versus cystic pancreatic neoplasm to rule out a mild acute colitis Following Medications were ordered in ER: Medications  LORazepam (ATIVAN) injection 2-3 mg (2 mg Intravenous Given 09/30/17 1948)  iopamidol (ISOVUE-300) 61 % injection (not administered)  sodium chloride 0.9 % bolus 2,000 mL (2,000 mLs Intravenous New Bag/Given 09/30/17 1937)  HYDROmorphone (DILAUDID) injection 0.5 mg (0.5 mg Intravenous Given 09/30/17 1937)  sodium chloride 0.9 % 1,000 mL with thiamine 100 mg, folic acid 1 mg, multivitamins adult 10 mL infusion ( Intravenous New Bag/Given 09/30/17 2011)  iopamidol (ISOVUE-300) 61 % injection 100 mL (100 mLs Intravenous Contrast Given 09/30/17 2028)    Hospitalist was called for admission for Acute alcohol withdrawal and possible small pseudocyst  Review of Systems:    Pertinent positives include: tremor, abdominal pain, nausea, vomiting,   Constitutional:  No weight loss,  night sweats, Fevers, chills, fatigue, weight loss  HEENT:  No headaches, Difficulty swallowing,Tooth/dental problems,Sore throat,  No sneezing, itching, ear ache, nasal congestion, post nasal drip,  Cardio-vascular:  No chest pain, Orthopnea, PND, anasarca, dizziness, palpitations.no Bilateral lower extremity swelling  GI:  No heartburn, indigestiondiarrhea, change in bowel habits, loss of appetite, melena, blood in stool, hematemesis Resp:  no shortness of breath at rest. No dyspnea on exertion, No excess mucus, no productive cough, No non-productive cough, No coughing up of blood.No change in color of mucus.No wheezing. Skin:  no rash or lesions. No jaundice GU:  no dysuria, change in color of urine, no  urgency or frequency. No straining to urinate.  No flank pain.  Musculoskeletal:  No joint pain or no joint swelling. No decreased range of motion. No back pain.  Psych:  No change in mood or affect. No depression or anxiety. No memory loss.  Neuro: no localizing neurological complaints, no tingling, no weakness, no double vision, no gait abnormality, no slurred speech, no confusion  As per HPI otherwise 10 point review of systems negative.   Past Medical History: Past Medical History:  Diagnosis Date  . Pancreatitis    History reviewed. No pertinent surgical history.   Social History:  Ambulatory   independently       reports that he has quit smoking. His smoking use included Cigarettes. He smoked 0.15 packs per day. He has never used smokeless tobacco. He reports that he drinks alcohol. He reports that he uses drugs, including Marijuana.  Allergies:   Allergies  Allergen Reactions  . Banana Hives  . Celery Oil Hives    Celery   . Pineapple Hives       Family History:   Family History  Problem Relation Age of Onset  . Alcohol abuse Mother   . Diabetes Mother   . Alcohol abuse Father   . Hypertension Father   . CAD Other   . Diabetes Other   . Hypertension Other   . Stroke Other     Medications: Prior to Admission medications   Medication Sig Start Date End Date Taking? Authorizing Provider  ondansetron (ZOFRAN) 4 MG tablet Take 4 mg by mouth every 8 (eight) hours as needed for nausea or vomiting.   Yes [provider]    Physical Exam: Patient Vitals for the past 24 hrs:  BP Temp Temp src Pulse Resp SpO2 Height Weight  09/30/17 2103 128/74 - - 92 19 100 % - -  09/30/17 1953 (!) 141/80 - - 100 20 100 % - -  09/30/17 1847 134/78 - - 86 - - - -  09/30/17 1754 134/78 98.9 F (37.2 C) Oral 86 19 98 %  (1.778 m) 67.1 kg (148 lb)    1. General:  in No Acute distress  well  -appearing 2. Psychological: Alert and  Oriented 3. Head/ENT:      Dry Mucous Membranes                          Head Non traumatic, neck supple                          Normal   Dentition 4. SKIN:  decreased Skin turgor,  Skin clean Dry and intact no rash 5. Heart: Regular rate and rhythm no  Murmur, no Rub or gallop 6. Lungs:   no wheezes or crackles  7. Abdomen: Soft,  tender, Non distended bowel sounds present 8. Lower extremities: no clubbing, cyanosis, or edema 9. Neurologically Grossly intact, moving all 4 extremities equally  tremolous 10. MSK: Normal range of motion   body mass index is 21.24 kg/m.  Labs on Admission:   Labs on Admission: I have personally reviewed following labs and imaging studies  CBC:  Recent Labs Lab 09/30/17 1853  WBC 7.9  HGB 14.7  HCT 42.4  MCV 96.1  PLT 288   Basic Metabolic Panel:  Recent Labs Lab 09/30/17 1853  NA 138  K 4.1  CL 101  CO2 22  GLUCOSE 78  BUN 13  CREATININE 0.81  CALCIUM 9.9   GFR: Estimated Creatinine Clearance: 135.8 mL/min (by C-G formula based on SCr of 0.81 mg/dL). Liver Function Tests:  Recent Labs Lab 09/30/17 1853  AST 79*  ALT 41  ALKPHOS 65  BILITOT 1.8*  PROT 7.9  ALBUMIN 4.9    Recent Labs Lab 09/30/17 1853  LIPASE 18   No results for input(s): AMMONIA in the last 168 hours. Coagulation Profile: No results for input(s): INR, PROTIME in the last 168 hours. Cardiac Enzymes: No results for input(s): CKTOTAL, CKMB, CKMBINDEX, TROPONINI in the last 168 hours. BNP (last 3 results) No results for input(s): PROBNP in the last 8760 hours. HbA1C: No results for input(s): HGBA1C in the last 72 hours. CBG: No results for input(s): GLUCAP in the last 168 hours. Lipid Profile: No results for input(s): CHOL, HDL, LDLCALC, TRIG, CHOLHDL, LDLDIRECT in the last 72 hours. Thyroid Function Tests: No results for input(s): TSH, T4TOTAL, FREET4, T3FREE, THYROIDAB in the last 72 hours. Anemia Panel: No results for input(s): VITAMINB12, FOLATE, FERRITIN, TIBC,  IRON, RETICCTPCT in the last 72 hours. Urine analysis:    Component Value Date/Time   COLORURINE YELLOW 09/30/2017 1853   APPEARANCEUR CLEAR 09/30/2017 1853   LABSPEC 1.020 09/30/2017 1853   PHURINE 5.0 09/30/2017 1853   GLUCOSEU NEGATIVE 09/30/2017 1853   HGBUR NEGATIVE 09/30/2017 1853   BILIRUBINUR NEGATIVE 09/30/2017 1853   KETONESUR 20 (A) 09/30/2017 1853   PROTEINUR NEGATIVE 09/30/2017 1853   NITRITE NEGATIVE 09/30/2017 1853   LEUKOCYTESUR NEGATIVE 09/30/2017 1853   Sepsis Labs: @LABRCNTIP (procalcitonin:4,lacticidven:4) )No results found for this or any previous visit (from the past 240 hour(s)).     UA   no evidence of UTI     No results found for: HGBA1C  Estimated Creatinine Clearance: 135.8 mL/min (by C-G formula based on SCr of 0.81 mg/dL).  BNP (last 3 results) No results for input(s): PROBNP in the last 8760 hours.   ECG REPORT Not obtained  Filed Weights   09/30/17 1754  Weight: 67.1 kg (148 lb)     Cultures:    Component Value Date/Time   SDES URINE, CLEAN CATCH 08/28/2017 0658   SPECREQUEST NONE 08/28/2017 0658   CULT  08/28/2017 0658    NO GROWTH Performed at [facility], [address] [place], [place] [address]    REPTSTATUS 08/29/2017 FINAL 08/28/2017 0658     Radiological Exams on Admission: Ct Abdomen Pelvis W Contrast  Result Date: 09/30/2017 CLINICAL DATA:  21 y/o M; left-sided abdominal pain since yesterday. History of pancreatitis. EXAM: CT ABDOMEN AND PELVIS WITH CONTRAST TECHNIQUE: Multidetector CT imaging of the abdomen and pelvis was performed using the standard protocol following bolus administration of intravenous contrast. CONTRAST:  <[id number] [phone] [id number] [phone] [phone] Hi<MEASUREMENTT [phone]anda[place]on Hatchet IOPAMIDOL (ISOVUE-300) INJECTION 61% COMPARISON:  None. FINDINGS: Lower chest: No acute abnormality. Hepatobiliary: No  focal liver abnormality is seen. No gallstones, gallbladder wall thickening, or biliary dilatation. Pancreas: Low-attenuation well-circumscribed lesion arising  from the tail of the pancreas extending into the left pre renal space measuring 16 x 36 x 18 mm (AP x ML x CC series 2, image 22 and series 4, image 55). No pancreatic ductal dilatation. No appreciable peripancreatic inflammation. Spleen: Normal in size without focal abnormality. Adrenals/Urinary Tract: Adrenal glands are unremarkable. Kidneys are normal, without renal calculi, focal lesion, or hydronephrosis. Bladder is unremarkable. Stomach/Bowel: Mild wall thickening of the upper descending colon and fat stranding within the left pericolic gutter may represent a mild acute colitis. Otherwise there is no obstructive or inflammatory changes of the large and small bowel. The stomach is unremarkable. Normal appendix. Vascular/Lymphatic: No significant vascular findings are present. No enlarged abdominal or pelvic lymph nodes. Reproductive: Prostate is unremarkable. Other: No abdominal wall hernia or abnormality. No abdominopelvic ascites. Musculoskeletal: No acute or significant osseous findings. IMPRESSION: 1. Low attenuating well-circumscribed lesion arising from the tail of pancreas extending into left pre renal space measuring up to 36 mm. Differential includes cystic pancreatic neoplasm and pseudocyst given history of acute pancreatitis. Further evaluation with MRI/MRCP with and without contrast is recommended on a nonemergent basis. 2. Mild wall thickening of the upper descending colon and edema within left pericolic gutter. Findings may represent mild infectious colitis or possibly reactive inflammation related to the adjacent pancreatic tail lesion. Electronically Signed   By: Mitzi Hansen M.D.   On: 09/30/2017 21:15    Chart has been reviewed    Assessment/Plan  21 y.o. male with medical history significant of alcohol abuse and pancreatitis currently student at [employer] Admitted for acute alcohol withdrawal possible pancreatic pseudocyst   Present on Admission: . Alcohol withdrawal  delirium, acute, hyperactive (HCC) - admit to step down on CIWA protocol and social work consult to help with abstinence . Alcohol abuse patient currently interested in quitting drinking order social work consult replace electrolytes as needed administer IV thiamine folate Chronic pancreatitis recurrent abdominal pain no fevers or chills or recent diarrhea to suggest colitis CT scan showing cystic lesion on the pancreas with some peri-inflammatory changes around that area once stable cancer father evaluate this with MRI continue pain management Tobacco abuse we'll cover importance of quitting  Other plan as per orders.  DVT prophylaxis:  SCD       Code Status:  FULL COD as per patient    Family Communication:   Family not at  Bedside   Disposition Plan:      To home once workup is complete and patient is stable                        Social Work     consulted                          Consults called: none  Admission status:    inpatient       Level of care       SDU      I have spent a total of 56 min on this admission   Steven Mckenzie 09/30/2017, 11:32 PM    [facility]  Pager [phone]   after 2 AM please page floor coverage PA If 7AM-7PM, please contact the day team taking care of the patient  [email or web address]  Password [email or web address]

## 2017-10-01 DIAGNOSIS — K863 Pseudocyst of pancreas: Secondary | ICD-10-CM | POA: Diagnosis present

## 2017-10-01 DIAGNOSIS — D649 Anemia, unspecified: Secondary | ICD-10-CM | POA: Diagnosis present

## 2017-10-01 LAB — MRSA PCR SCREENING: MRSA by PCR: NEGATIVE

## 2017-10-01 LAB — PROTIME-INR
INR: 1.03
PROTHROMBIN TIME: 13.4 s (ref 11.4–15.2)

## 2017-10-01 LAB — COMPREHENSIVE METABOLIC PANEL
ALBUMIN: 3.6 g/dL (ref 3.5–5.0)
ALK PHOS: 52 U/L (ref 38–126)
ALT: 30 U/L (ref 17–63)
AST: 45 U/L — AB (ref 15–41)
Anion gap: 10 (ref 5–15)
BILIRUBIN TOTAL: 1.5 mg/dL — AB (ref 0.3–1.2)
BUN: 9 mg/dL (ref 6–20)
CALCIUM: 8.5 mg/dL — AB (ref 8.9–10.3)
CO2: 24 mmol/L (ref 22–32)
CREATININE: 0.87 mg/dL (ref 0.61–1.24)
Chloride: 101 mmol/L (ref 101–111)
GFR calc Af Amer: >60 mL/min (ref >60)
GFR calc non Af Amer: >60 mL/min (ref >60)
GLUCOSE: 88 mg/dL (ref 65–99)
Potassium: 3.6 mmol/L (ref 3.5–5.1)
Sodium: 135 mmol/L (ref 135–145)
TOTAL PROTEIN: 6 g/dL — AB (ref 6.5–8.1)

## 2017-10-01 LAB — CBC
HCT: 36.5 % — ABNORMAL LOW (ref 39.0–52.0)
Hemoglobin: 12.2 g/dL — ABNORMAL LOW (ref 13.0–17.0)
MCH: 32.3 pg (ref 26.0–34.0)
MCHC: 33.4 g/dL (ref 30.0–36.0)
MCV: 96.6 fL (ref 78.0–100.0)
Platelets: 210 10*3/uL (ref 150–400)
RBC: 3.78 MIL/uL — ABNORMAL LOW (ref 4.22–5.81)
RDW: 13.3 % (ref 11.5–15.5)
WBC: 4.9 10*3/uL (ref 4.0–10.5)

## 2017-10-01 LAB — MAGNESIUM: MAGNESIUM: 1.4 mg/dL — AB (ref 1.7–2.4)

## 2017-10-01 LAB — TSH: TSH: 2.019 u[IU]/mL (ref 0.350–4.500)

## 2017-10-01 LAB — APTT: aPTT: 33 seconds (ref 24–36)

## 2017-10-01 LAB — PHOSPHORUS: Phosphorus: 3.3 mg/dL (ref 2.5–4.6)

## 2017-10-01 MED ORDER — MAGNESIUM SULFATE 4 GM/100ML IV SOLN
4.0000 g | Freq: Once | INTRAVENOUS | Status: AC
  Administered 2017-10-01: 4 g via INTRAVENOUS
  Filled 2017-10-01: qty 100

## 2017-10-01 MED ORDER — SODIUM CHLORIDE 0.9 % IV SOLN
INTRAVENOUS | Status: DC
  Administered 2017-10-01: 04:00:00 via INTRAVENOUS

## 2017-10-01 MED ORDER — ONDANSETRON HCL 4 MG PO TABS: 4.0000 mg | ORAL_TABLET | Freq: Four times a day (QID) | ORAL | Status: DC | PRN

## 2017-10-01 MED ORDER — LORAZEPAM 2 MG/ML IJ SOLN
2.0000 mg | INTRAMUSCULAR | Status: DC | PRN
  Filled 2017-10-01 (×2): qty 1

## 2017-10-01 MED ORDER — HYDROMORPHONE HCL 1 MG/ML IJ SOLN
0.5000 mg | INTRAMUSCULAR | Status: DC | PRN
  Administered 2017-10-01 (×3): 0.5 mg via INTRAVENOUS
  Filled 2017-10-01 (×2): qty 0.5
  Filled 2017-10-01: qty 1

## 2017-10-01 MED ORDER — POTASSIUM CHLORIDE IN NACL 20-0.9 MEQ/L-% IV SOLN
INTRAVENOUS | Status: DC
  Administered 2017-10-01 – 2017-10-02 (×3): via INTRAVENOUS
  Filled 2017-10-01 (×7): qty 1000

## 2017-10-01 MED ORDER — ONDANSETRON HCL 4 MG/2ML IJ SOLN
4.0000 mg | Freq: Four times a day (QID) | INTRAMUSCULAR | Status: DC | PRN
  Administered 2017-10-01: 4 mg via INTRAVENOUS
  Filled 2017-10-01: qty 2

## 2017-10-01 MED ORDER — THIAMINE HCL 100 MG/ML IJ SOLN
100.0000 mg | Freq: Every day | INTRAMUSCULAR | Status: DC
  Administered 2017-10-01 – 2017-10-02 (×2): 100 mg via INTRAVENOUS
  Filled 2017-10-01 (×3): qty 2

## 2017-10-01 MED ORDER — DIPHENHYDRAMINE HCL 25 MG PO CAPS
25.0000 mg | ORAL_CAPSULE | Freq: Three times a day (TID) | ORAL | Status: DC | PRN
Start: 2017-10-01 — End: 2017-10-05
  Administered 2017-10-01 – 2017-10-05 (×7): 25 mg via ORAL
  Filled 2017-10-01 (×7): qty 1

## 2017-10-01 MED ORDER — FOLIC ACID 5 MG/ML IJ SOLN
1.0000 mg | Freq: Every day | INTRAMUSCULAR | Status: DC
  Administered 2017-10-01 – 2017-10-03 (×3): 1 mg via INTRAVENOUS
  Filled 2017-10-01 (×5): qty 0.2

## 2017-10-01 MED ORDER — ACETAMINOPHEN 650 MG RE SUPP: 650.0000 mg | Freq: Four times a day (QID) | RECTAL | Status: DC | PRN

## 2017-10-01 MED ORDER — OXYCODONE-ACETAMINOPHEN 5-325 MG PO TABS
1.0000 | ORAL_TABLET | Freq: Four times a day (QID) | ORAL | Status: DC | PRN
  Administered 2017-10-01 – 2017-10-02 (×3): 1 via ORAL
  Filled 2017-10-01 (×3): qty 1

## 2017-10-01 MED ORDER — ACETAMINOPHEN 325 MG PO TABS: 650.0000 mg | ORAL_TABLET | Freq: Four times a day (QID) | ORAL | Status: DC | PRN

## 2017-10-01 NOTE — Clinical Social Work Note (Signed)
CSW received sub abuse consult, met with pt @ bedside who reported he went to [facility] for inpt sub abuse treatment and was told he needed medical clearance.  Once medically stable the plan is for pt to go back to [facility] for inpt sub abuse treatment. CSW will call [facility] to advise.  Following for DC planning.  Viet Kemmerer B. Joline Maxcy Clinical Social Work Dept Weekend Social Worker [phone] 3:31 PM

## 2017-10-01 NOTE — ED Provider Notes (Signed)
Patient is now ready for transport to [facility]. He is stable, VS unremarkable. Stable for transfer.   Pricilla Loveless, MD 10/01/17 [phone]

## 2017-10-01 NOTE — Progress Notes (Signed)
Progress Note    Steven Mckenzie  ZOX:[id number] DOB: [birth year]  DOA: 09/30/2017 PCP: Patient, No Pcp Per    Brief Narrative:   Chief complaint: F/U ETOH withdrawal  Medical records reviewed and are as summarized below:  Steven Mckenzie is an 21 y.o. male the PMH of alcohol-related pancreatitis and alcohol abuse since the age of 14 who was admitted 09/30/17 for evaluation of active hallucinations and alcohol withdrawal. The patient admitted to drinking over a fifth of liquor daily. CT of the abdomen and pelvis showed a possible pseudocyst versus cystic pancreatic neoplasm  Assessment/Plan:    Principal Problem:   Alcohol withdrawal delirium, acute, hyperactive (HCC)/Alcohol abuse CIWA scores high as 10. Continue Ativan per detox protocol. Continue supplement thiamine and folic acid. Continue to monitor in the SDU given significant disorientation and high risk for deterioration.  Active Problems:   Hypomagnesemia Replete.    History of pancreatitis Lipase not elevated.    Normocytic anemia Mild. Monitor.  Body mass index is 21.24 kg/m.   Family Communication/Anticipated D/C date and plan/Code Status   DVT prophylaxis: SCDs ordered. Code Status: Full Code.  Family Communication: No family at the bedside. Disposition Plan: Home when detox completed, likely several more days.High risk for delirium tremens.   Medical Consultants:    None   Anti-Infectives:    None  Subjective:   Confused and disoriented when awakened, quickly falls back to sleep.  Objective:    Vitals:   10/01/17 0238 10/01/17 0240 10/01/17 0319 10/01/17 0430  BP: 137/75 137/75 134/89 121/83  Pulse: 65 65 66 (!) 54  Resp: 18     Temp: 97.6 F (36.4 C)  98.7 F (37.1 C)   TempSrc: Oral  Oral   SpO2: 98%     Weight:      Height:        Intake/Output Summary (Last 24 hours) at 10/01/17 0748 Last data filed at 10/01/17 0600  Gross per 24 hour  Intake          2431.67 ml    Output              425 ml  Net          2006.67 ml   Filed Weights   09/30/17 1754  Weight: 67.1 kg (148 lb)    Exam: General: Somnolent. Mildly agitated when awakened. Cardiovascular: Heart sounds show a regular rate, and rhythm. No gallops or rubs. No murmurs. No JVD. Lungs: Clear to auscultation bilaterally with good air movement. No rales, rhonchi or wheezes. Abdomen: Soft, nontender, nondistended with normal active bowel sounds. No masses. No hepatosplenomegaly. Neurological: Somnolent/disoriented when awakened. Moves all extremities 4 spontaneously. Cranial nerves II through XII grossly intact but unable to fully test due to lack of patient's ability to cooperate. Skin: Warm and dry. No rashes or lesions. Multiple tattoos. Extremities: No clubbing or cyanosis. No edema. Pedal pulses 2+. Psychiatric: Mood and affect are flat. Insight and judgment are poor.   Data Reviewed:   I have personally reviewed following labs and imaging studies:  Labs: Labs show the following:   Basic Metabolic Panel:  Recent Labs Lab 09/30/17 1853 10/01/17 0359  NA 138 135  K 4.1 3.6  CL 101 101  CO2 22 24  GLUCOSE 78 88  BUN 13 9  CREATININE 0.81 0.87  CALCIUM 9.9 8.5*  MG  --  1.4*  PHOS  --  3.3   GFR Estimated Creatinine Clearance: 126.4 mL/min (by  C-G formula based on SCr of 0.87 mg/dL). Liver Function Tests:  Recent Labs Lab 09/30/17 1853 10/01/17 0359  AST 79* 45*  ALT 41 30  ALKPHOS 65 52  BILITOT 1.8* 1.5*  PROT 7.9 6.0*  ALBUMIN 4.9 3.6    Recent Labs Lab 09/30/17 1853  LIPASE 18   Coagulation profile  Recent Labs Lab 10/01/17 0359  INR 1.03    CBC:  Recent Labs Lab 09/30/17 1853 10/01/17 0359  WBC 7.9 4.9  HGB 14.7 12.2*  HCT 42.4 36.5*  MCV 96.1 96.6  PLT 288 210   Thyroid function studies:  Recent Labs  10/01/17 0359  TSH 2.019   Microbiology Recent Results (from the past 240 hour(s))  MRSA PCR Screening     Status: None    Collection Time: 10/01/17  3:40 AM  Result Value Ref Range Status   MRSA by PCR NEGATIVE NEGATIVE Final    Comment:        The GeneXpert MRSA Assay (FDA approved for NASAL specimens only), is one component of a comprehensive MRSA colonization surveillance program. It is not intended to diagnose MRSA infection nor to guide or monitor treatment for MRSA infections.     Procedures and diagnostic studies:  09/30/17 Ct Abdomen Pelvis W Contrast: My independent review of the image shows: Oval-shaped lesion in the area of the tail of the pancreas. Likely pseudocyst. Colonic wall thickening left descending colon right underneath the diaphragm.    Medications:   . folic acid  1 mg Intravenous Daily  . thiamine  100 mg Intravenous Daily   Continuous Infusions: . sodium chloride 125 mL/hr at 10/01/17 0428     LOS: 1 day   RAMA,CHRISTINA  [facility] Pager [phone]. If unable to reach me by pager, please call my cell phone at [phone].  *Please refer to [email or web address], password [email or web address] to get updated schedule on who will round on this patient, as hospitalists switch teams weekly. If 7PM-7AM, please contact night-coverage at [email or web address], password [email or web address] for any overnight needs.  10/01/2017, 7:48 AM

## 2017-10-01 NOTE — Clinical Social Work Note (Signed)
CSW spoke to [facility], pt placed on bed list. The plan is for pt to go back to inpt sub abuse treatment post DC. Pt agreeable.  Following for DC planning.  Tyrann Donaho B. Gean Quint Clinical Social Work Dept Weekend Social Worker [phone] 3:55 PM

## 2017-10-01 NOTE — Progress Notes (Signed)
Writer spoke with [facility] Inetta Fermo and was informed that patient does not have a bed at [facility] and that once medically cleared patient can be reviewed.  Melbourne Abts, MSW, LCSWA Clinical social worker in disposition [facility], TTS Office

## 2017-10-01 NOTE — ED Notes (Signed)
[facility] WILL TRANSPORT PT TO [facility] 2C[facility]9. AAOX4. PT IN NO APPARENT DISTRESS WITH MILD PAIN.  IVF INFUSING W/O PAIN OR SWELLING. THE OPPORTUNITY TO ASK QUESTIONS WAS PROVIDED.

## 2017-10-01 NOTE — Progress Notes (Signed)
CM consult placed for transportation needs due to patient's care at [facility]. CSW can provide bus pass for patient at time of DC to get to [place] [facility] to pick up car if patient cannot arrange friends/ family to pick him up and take him there at time of DC.

## 2017-10-02 LAB — COMPREHENSIVE METABOLIC PANEL
ALBUMIN: 4 g/dL (ref 3.5–5.0)
ALT: 28 U/L (ref 17–63)
ANION GAP: 10 (ref 5–15)
AST: 57 U/L — ABNORMAL HIGH (ref 15–41)
Alkaline Phosphatase: 63 U/L (ref 38–126)
BUN: <5 mg/dL — ABNORMAL LOW (ref 6–20)
CO2: 25 mmol/L (ref 22–32)
Calcium: 9.1 mg/dL (ref 8.9–10.3)
Chloride: 100 mmol/L — ABNORMAL LOW (ref 101–111)
Creatinine, Ser: 0.9 mg/dL (ref 0.61–1.24)
GFR calc Af Amer: >60 mL/min (ref >60)
GFR calc non Af Amer: >60 mL/min (ref >60)
GLUCOSE: 134 mg/dL — AB (ref 65–99)
POTASSIUM: 3.6 mmol/L (ref 3.5–5.1)
SODIUM: 135 mmol/L (ref 135–145)
Total Bilirubin: 1.1 mg/dL (ref 0.3–1.2)
Total Protein: 7 g/dL (ref 6.5–8.1)

## 2017-10-02 LAB — CBC
HEMATOCRIT: 42 % (ref 39.0–52.0)
HEMOGLOBIN: 14.4 g/dL (ref 13.0–17.0)
MCH: 32.4 pg (ref 26.0–34.0)
MCHC: 34.3 g/dL (ref 30.0–36.0)
MCV: 94.6 fL (ref 78.0–100.0)
Platelets: 221 10*3/uL (ref 150–400)
RBC: 4.44 MIL/uL (ref 4.22–5.81)
RDW: 12.8 % (ref 11.5–15.5)
WBC: 5.6 10*3/uL (ref 4.0–10.5)

## 2017-10-02 MED ORDER — HYDROCODONE-ACETAMINOPHEN 5-325 MG PO TABS
1.0000 | ORAL_TABLET | ORAL | Status: DC | PRN
  Administered 2017-10-02 – 2017-10-03 (×3): 2 via ORAL
  Filled 2017-10-02 (×3): qty 2

## 2017-10-02 MED ORDER — KETOROLAC TROMETHAMINE 30 MG/ML IJ SOLN: 30.0000 mg | Freq: Four times a day (QID) | INTRAMUSCULAR | Status: DC | PRN

## 2017-10-02 MED ORDER — KETOROLAC TROMETHAMINE 30 MG/ML IJ SOLN
30.0000 mg | Freq: Once | INTRAMUSCULAR | Status: AC
  Administered 2017-10-02: 30 mg via INTRAVENOUS
  Filled 2017-10-02: qty 1

## 2017-10-02 NOTE — Progress Notes (Signed)
Progress Note    Steven Mckenzie  RUE:[id number] DOB: [birth year]  DOA: 09/30/2017 PCP: Patient, No Pcp Per    Brief Narrative:   Chief complaint: F/U ETOH withdrawal  Medical records reviewed and are as summarized below:  Steven Mckenzie is an 21 y.o. 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 The patient admitted to drinking over a fifth of liquor daily. CT of the abdomen and pelvis showed a possible pseudocyst versus cystic pancreatic neoplasm  Assessment/Plan:    Principal Problem:   Alcohol withdrawal delirium, acute, hyperactive (HCC)/Alcohol abuse CIWA scores 2-3 over the past 24 hours. Continue Ativan per detox protocol. Continue supplement thiamine and folic acid. Transfer to telemetry. Medication seeking, and asking for Dilaudid by name. Psychiatry consultation requested.  Active Problems:   Hypomagnesemia Repleted. Recheck levels in the morning.    History of pancreatitis Continues to report abdominal pain. Wants Dilaudid. Lipase not elevated. No indication for IV narcotics as the patient is tolerating by mouth's.    Normocytic anemia Mild. Monitor.   Family Communication/Anticipated D/C date and plan/Code Status   DVT prophylaxis: SCDs ordered. Code Status: Full Code.  Family Communication: No family at the bedside. Disposition Plan: Home when detox completed, likely several more days. High risk for delirium tremens.   Medical Consultants:    Psychiatry   Anti-Infectives:    None  Subjective:   Says he feels "confused". Reports abdominal pain and nausea/vomiting.  Wants pain medications.  Objective:    Vitals:   10/01/17 2007 10/02/17 0015 10/02/17 0313 10/02/17 0315  BP: 133/79 131/87  120/74  Pulse: 76 93  80  Resp: 13 (!) 25  20  Temp: 98.3 F (36.8 C) 98.3 F (36.8 C) 98.6 F (37 C)   TempSrc: Oral Oral  Oral   SpO2: 100% 98%    Weight:      Height:        Intake/Output Summary (Last 24 hours) at 10/02/17 0735 Last data filed at 10/02/17 0600  Gross per 24 hour  Intake             1545 ml  Output             3675 ml  Net            -2130 ml   Filed Weights   09/30/17 1754  Weight: 67.1 kg (148 lb)    Exam: General: No acute distress. Cardiovascular: Heart sounds show a regular rate, and rhythm. No gallops or rubs. No murmurs. No JVD. Lungs: Clear to auscultation bilaterally with good air movement. No rales, rhonchi or wheezes. Abdomen: Soft, nontender, nondistended with normal active bowel sounds. No masses. No hepatosplenomegaly. Neurological: Alert and oriented 2. Moves all extremities 4 with equal strength. Cranial nerves II through XII grossly intact. Extremities: No clubbing or cyanosis. No edema. Pedal pulses 2+. Psychiatric: Mood and affect are flat. Insight and judgment are poor.   Data Reviewed:   I have personally reviewed following labs and imaging studies:  Labs: Labs show the following:   Basic Metabolic Panel:  Recent Labs Lab 09/30/17 1853 10/01/17 0359 10/02/17 0248  NA 138 135 135  K 4.1 3.6 3.6  CL 101 101 100*  CO2 GLUCOSE 78 88 134*  BUN 13 9 <5*  CREATININE 0.81 0.87 0.90  CALCIUM 9.9 8.5* 9.1  MG  --  1.4*  --   PHOS  --  3.3  --    GFR Estimated Creatinine Clearance: 122.2 mL/min (by C-G formula based on SCr of 0.9 mg/dL). Liver Function Tests:  Recent Labs Lab 09/30/17 1853 10/01/17 0359 10/02/17 0248  AST 79* 45* 57*  ALT 41 30 28  ALKPHOS 65 52 63  BILITOT 1.8* 1.5* 1.1  PROT 7.9 6.0* 7.0  ALBUMIN 4.9 3.6 4.0    Recent Labs Lab 09/30/17 1853  LIPASE 18   Coagulation profile  Recent Labs Lab 10/01/17 0359  INR 1.03    CBC:  Recent Labs Lab 09/30/17 1853 10/01/17 0359 10/02/17 0248  WBC 7.9 4.9 5.6  HGB 14.7 12.2* 14.4  HCT 42.4 36.5* 42.0  MCV 96.1 96.6 94.6  PLT 288 210 221   Thyroid  function studies:  Recent Labs  10/01/17 0359  TSH 2.019   Microbiology 10/01/17: MRSA PCR negative  Procedures and diagnostic studies:  09/30/17 Ct Abdomen Pelvis W Contrast:: 1. Low attenuating well-circumscribed lesion arising from the tail of pancreas extending into left pre renal space measuring up to 36 mm. Differential includes cystic pancreatic neoplasm and pseudocyst given history of acute pancreatitis. Further evaluation with MRI/MRCP with and without contrast is recommended on a nonemergent basis. 2. Mild wall thickening of the upper descending colon and edema within left pericolic gutter. Findings may represent mild infectious colitis or possibly reactive inflammation related to the adjacent pancreatic tail lesion.     Medications:   . folic acid  1 mg Intravenous Daily  . thiamine  100 mg Intravenous Daily   Continuous Infusions: . 0.9 % NaCl with KCl 20 mEq / L 100 mL/hr at 10/02/17 0500     LOS: 2 days   Javione Gunawan  [facility] Pager [phone]. If unable to reach me by pager, please call my cell phone at [phone].  *Please refer to [email or web address], password [email or web address] to get updated schedule on who will round on this patient, as hospitalists switch teams weekly. If 7PM-7AM, please contact night-coverage at [email or web address], password [email or web address] for any overnight needs.  10/02/2017, 7:35 AM

## 2017-10-02 NOTE — Progress Notes (Signed)
Report called pt to transfer to 2W01 when room clean. Will notify mom of new room number.

## 2017-10-03 DIAGNOSIS — F17210 Nicotine dependence, cigarettes, uncomplicated: Secondary | ICD-10-CM

## 2017-10-03 DIAGNOSIS — E876 Hypokalemia: Secondary | ICD-10-CM

## 2017-10-03 DIAGNOSIS — F10229 Alcohol dependence with intoxication, unspecified: Secondary | ICD-10-CM

## 2017-10-03 DIAGNOSIS — F1210 Cannabis abuse, uncomplicated: Secondary | ICD-10-CM

## 2017-10-03 DIAGNOSIS — Z811 Family history of alcohol abuse and dependence: Secondary | ICD-10-CM

## 2017-10-03 DIAGNOSIS — R109 Unspecified abdominal pain: Secondary | ICD-10-CM

## 2017-10-03 LAB — COMPREHENSIVE METABOLIC PANEL
ALT: 27 U/L (ref 17–63)
ANION GAP: 8 (ref 5–15)
AST: 32 U/L (ref 15–41)
Albumin: 4 g/dL (ref 3.5–5.0)
Alkaline Phosphatase: 60 U/L (ref 38–126)
BUN: <5 mg/dL — ABNORMAL LOW (ref 6–20)
CHLORIDE: 103 mmol/L (ref 101–111)
CO2: 26 mmol/L (ref 22–32)
CREATININE: 0.91 mg/dL (ref 0.61–1.24)
Calcium: 9.6 mg/dL (ref 8.9–10.3)
Glucose, Bld: 96 mg/dL (ref 65–99)
Potassium: 4.4 mmol/L (ref 3.5–5.1)
Sodium: 137 mmol/L (ref 135–145)
Total Bilirubin: 1 mg/dL (ref 0.3–1.2)
Total Protein: 7 g/dL (ref 6.5–8.1)

## 2017-10-03 LAB — MAGNESIUM: MAGNESIUM: 1.8 mg/dL (ref 1.7–2.4)

## 2017-10-03 LAB — LIPASE, BLOOD: LIPASE: 17 U/L (ref 11–51)

## 2017-10-03 MED ORDER — LORAZEPAM 2 MG/ML IJ SOLN
1.0000 mg | Freq: Four times a day (QID) | INTRAMUSCULAR | Status: DC | PRN
  Administered 2017-10-03 – 2017-10-04 (×4): 1 mg via INTRAVENOUS
  Filled 2017-10-03 (×4): qty 1

## 2017-10-03 MED ORDER — LORAZEPAM 1 MG PO TABS
1.0000 mg | ORAL_TABLET | Freq: Four times a day (QID) | ORAL | Status: DC | PRN
  Administered 2017-10-04 – 2017-10-05 (×2): 1 mg via ORAL
  Filled 2017-10-03 (×2): qty 1

## 2017-10-03 MED ORDER — VITAMIN B-1 100 MG PO TABS
100.0000 mg | ORAL_TABLET | Freq: Every day | ORAL | Status: DC
  Administered 2017-10-03 – 2017-10-05 (×3): 100 mg via ORAL
  Filled 2017-10-03 (×3): qty 1

## 2017-10-03 MED ORDER — OXYCODONE HCL 5 MG PO TABS
5.0000 mg | ORAL_TABLET | ORAL | Status: DC | PRN
  Administered 2017-10-03 – 2017-10-04 (×5): 10 mg via ORAL
  Administered 2017-10-05: 5 mg via ORAL
  Administered 2017-10-05 (×2): 10 mg via ORAL
  Filled 2017-10-03 (×8): qty 2

## 2017-10-03 MED ORDER — OXYCODONE HCL 5 MG PO TABS: 5.0000 mg | ORAL_TABLET | ORAL | Status: DC | PRN

## 2017-10-03 MED ORDER — PANTOPRAZOLE SODIUM 40 MG PO TBEC
40.0000 mg | DELAYED_RELEASE_TABLET | Freq: Every day | ORAL | Status: DC
  Administered 2017-10-03 – 2017-10-05 (×3): 40 mg via ORAL
  Filled 2017-10-03 (×3): qty 1

## 2017-10-03 MED ORDER — ACETAMINOPHEN 500 MG PO TABS: 500.0000 mg | ORAL_TABLET | Freq: Four times a day (QID) | ORAL | Status: DC | PRN

## 2017-10-03 MED ORDER — FOLIC ACID 1 MG PO TABS
1.0000 mg | ORAL_TABLET | Freq: Every day | ORAL | Status: DC
  Administered 2017-10-04 – 2017-10-05 (×2): 1 mg via ORAL
  Filled 2017-10-03 (×2): qty 1

## 2017-10-03 MED ORDER — PREGABALIN 25 MG PO CAPS
25.0000 mg | ORAL_CAPSULE | Freq: Every day | ORAL | Status: DC
Start: 2017-10-03 — End: 2017-10-05
  Administered 2017-10-03 – 2017-10-05 (×3): 25 mg via ORAL
  Filled 2017-10-03 (×3): qty 1

## 2017-10-03 MED ORDER — PANCRELIPASE (LIP-PROT-AMYL) 12000-38000 UNITS PO CPEP
12000.0000 [IU] | ORAL_CAPSULE | Freq: Three times a day (TID) | ORAL | Status: DC
  Administered 2017-10-03 – 2017-10-05 (×6): 12000 [IU] via ORAL
  Filled 2017-10-03 (×6): qty 1

## 2017-10-03 NOTE — Consult Note (Signed)
[facility] Face-to-Face Psychiatry Consult   Reason for Consult:  Alcohol withdrawal delirium Referring Physician:  Dr. Thereasa Solo Patient Identification: Steven Mckenzie MRN:  [id number] Principal Diagnosis: Alcohol withdrawal delirium, acute, hyperactive ([place]) Diagnosis:   Patient Active Problem List   Diagnosis Date Noted  . Normocytic anemia [D64.9] 10/01/2017  . Hypomagnesemia [E83.42] 10/01/2017  . 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 Alcohol withdrawal delirium, acute, hyperactive ([place]) [F10.231] 09/30/2017  . Alcohol abuse [F10.10] 09/30/2017  . Alcohol withdrawal ([place]) [F10.239] 09/30/2017  . Hypokalemia [E87.6] 05/17/2017    Total Time spent with patient: 1 hour  Subjective:   Steven Mckenzie is a 21 y.o. male patient admitted with Abdominal pain, pancreatic pseudocyst and alcohol withdrawal syndrome.  HPI:  Steven Mckenzie is a 21 y.o. male, student at [employer], admitted to the [facility] for abdominal pain, alcoholic pancreatitis, and seeking detox treatment for alcohol withdrawal syndrome. Patient was found with altered mental status and delirium on on admission. Patient has been receiving detox treatment, CIWA protocol, and pain medication management at this time. Patient reported he has been drinking since he was 21 years old and heavily drinking since he came to the [facility]. Patient also had DWI about a year ago at [facility], and has a legal charges and 2 weeks ago given provisional driver's license and has to participate in driving classes. Patient reported he has suffered nausea, vomiting, sweating, shaking, disturbed sleep and abdominal pain and reportedly started drinking first thing in the morning and drinks about 3 bottles of liquor daily and reported he could not stop it because of the ongoing withdrawal symptoms. Patient also missed so many classes and behind in his academic year. Patient is willing to continue his participation in detox treatment and has been  considering rehabilitation either residential substance abuse treatment or substance abuse intensive outpatient program. Patient came to the emergency department at least 6 times with a intoxication in the last 6 months and previous visit he signed againest medical advice a ingestion are regular twice. Patient blood alcohol level is 179 on arrival. Patient denied suicidal/homicidal ideation, intention or plans. Patient has no current auditory or visual hallucinations. Patient has a difficult to comprehend about the treatment options provided to him and repeatedly asking the same questions during this visit.    Past Psychiatric History: Alcohol abuse vs dependence and also received substance abuse , was evaluated at [facility] in [place] as a mandatory evaluation from court.   Risk to Self: Is patient at risk for suicide?: No Risk to Others:   Prior Inpatient Therapy:   Prior Outpatient Therapy:    Past Medical History:  Past Medical History:  Diagnosis Date  . Pancreatitis    History reviewed. No pertinent surgical history. Family History:  Family History  Problem Relation Age of Onset  . Alcohol abuse Mother   . Diabetes Mother   . Alcohol abuse Father   . Hypertension Father   . CAD Other   . Diabetes Other   . Hypertension Other   . Stroke Other    Family Psychiatric  History: Patient endorses multiple family members including mother and father has been alcohol problem and he has worst alcohol addiction.  Social History:  History  Alcohol Use  . Yes    Comment: daily      History  Drug Use  . Types: Marijuana    Social History   Social History  . Marital status: Single    Spouse name: N/A  .  Number of children: N/A  . Years of education: N/A   Social History Main Topics  . Smoking status: Current Every Day Smoker    Packs/day: 0.15    Types: Cigarettes  . Smokeless tobacco: Never Used  . Alcohol use Yes     Comment: daily   . Drug use: Yes    Types:  Marijuana  . Sexual activity: Yes   Other Topics Concern  . None   Social History Narrative  . None   Additional Social History:    Allergies:   Allergies  Allergen Reactions  . Banana Hives  . Celery Oil Hives    Celery   . Percocet [Oxycodone-Acetaminophen] Itching  . Pineapple Hives    Labs:  Results for orders placed or performed during the hospital encounter of 09/30/17 (from the past 48 hour(s))  Comprehensive metabolic panel     Status: Abnormal   Collection Time: 10/02/17  2:48 AM  Result Value Ref Range   Sodium 135 135 - 145 mmol/L   Potassium 3.6 3.5 - 5.1 mmol/L   Chloride 100 (L) 101 - 111 mmol/L   CO2 25 22 - 32 mmol/L   Glucose, Bld 134 (H) 65 - 99 mg/dL   BUN <5 (L) 6 - 20 mg/dL   Creatinine, Ser 0.90 0.61 - 1.24 mg/dL   Calcium 9.1 8.9 - 10.3 mg/dL   Total Protein 7.0 6.5 - 8.1 g/dL   Albumin 4.0 3.5 - 5.0 g/dL   AST 57 (H) 15 - 41 U/L   ALT 28 17 - 63 U/L   Alkaline Phosphatase 63 38 - 126 U/L   Total Bilirubin 1.1 0.3 - 1.2 mg/dL   GFR calc non Af Amer >60 >60 mL/min   GFR calc Af Amer >60 >60 mL/min    Comment: (NOTE) The eGFR has been calculated using the CKD EPI equation. This calculation has not been validated in all clinical situations. eGFR's persistently <60 mL/min signify possible Chronic Kidney Disease.    Anion gap 10 5 - 15  CBC     Status: None   Collection Time: 10/02/17  2:48 AM  Result Value Ref Range   WBC 5.6 4.0 - 10.5 K/uL   RBC 4.44 4.22 - 5.81 MIL/uL   Hemoglobin 14.4 13.0 - 17.0 g/dL   HCT 42.0 39.0 - 52.0 %   MCV 94.6 78.0 - 100.0 fL   MCH 32.4 26.0 - 34.0 pg   MCHC 34.3 30.0 - 36.0 g/dL   RDW 12.8 11.5 - 15.5 %   Platelets 221 150 - 400 K/uL  Magnesium     Status: None   Collection Time: 10/03/17  2:50 AM  Result Value Ref Range   Magnesium 1.8 1.7 - 2.4 mg/dL  Comprehensive metabolic panel     Status: Abnormal   Collection Time: 10/03/17  2:50 AM  Result Value Ref Range   Sodium 137 135 - 145 mmol/L    Potassium 4.4 3.5 - 5.1 mmol/L    Comment: NO VISIBLE HEMOLYSIS   Chloride 103 101 - 111 mmol/L   CO2 26 22 - 32 mmol/L   Glucose, Bld 96 65 - 99 mg/dL   BUN <5 (L) 6 - 20 mg/dL   Creatinine, Ser 0.91 0.61 - 1.24 mg/dL   Calcium 9.6 8.9 - 10.3 mg/dL   Total Protein 7.0 6.5 - 8.1 g/dL   Albumin 4.0 3.5 - 5.0 g/dL   AST 32 15 - 41 U/L   ALT 27 17 -  63 U/L   Alkaline Phosphatase 60 38 - 126 U/L   Total Bilirubin 1.0 0.3 - 1.2 mg/dL   GFR calc non Af Amer >60 >60 mL/min   GFR calc Af Amer >60 >60 mL/min    Comment: (NOTE) The eGFR has been calculated using the CKD EPI equation. This calculation has not been validated in all clinical situations. eGFR's persistently <60 mL/min signify possible Chronic Kidney Disease.    Anion gap 8 5 - 15  Lipase, blood     Status: None   Collection Time: 10/03/17  2:50 AM  Result Value Ref Range   Lipase 17 11 - 51 U/L    Current Facility-Administered Medications  Medication Dose Route Frequency Provider Last Rate Last Dose  . 0.9 % NaCl with KCl 20 mEq/ L  infusion   Intravenous Continuous Cherene Altes, MD 50 mL/hr at 10/03/17 1003    . acetaminophen (TYLENOL) tablet 500 mg  500 mg Oral Q6H PRN Cherene Altes, MD      . diphenhydrAMINE (BENADRYL) capsule 25 mg  25 mg Oral Q8H PRN Blount, Xenia T, NP   25 mg at 10/03/17 0100  . folic acid (FOLVITE) tablet 1 mg  1 mg Oral Daily Joette Catching T, MD      . ketorolac (TORADOL) 30 MG/ML injection 30 mg  30 mg Intravenous Q6H PRN Rama, Venetia Maxon, MD      . lipase/protease/amylase (CREON) capsule 12,000 Units  12,000 Units Oral TID WC Cherene Altes, MD   12,000 Units at 10/03/17 1040  . LORazepam (ATIVAN) tablet 1 mg  1 mg Oral Q6H PRN Cherene Altes, MD       Or  . LORazepam (ATIVAN) injection 1 mg  1 mg Intravenous Q6H PRN Cherene Altes, MD      . ondansetron [facility]) tablet 4 mg  4 mg Oral Q6H PRN Toy Baker, MD       Or  . ondansetron (ZOFRAN) injection 4 mg  4  mg Intravenous Q6H PRN Doutova, Anastassia, MD   4 mg at 10/01/17 1313  . oxyCODONE (Oxy IR/ROXICODONE) immediate release tablet 5-10 mg  5-10 mg Oral Q3H PRN Cherene Altes, MD      . pantoprazole (PROTONIX) EC tablet 40 mg  40 mg Oral Q1200 Cherene Altes, MD      . pregabalin (LYRICA) capsule 25 mg  25 mg Oral Daily Cherene Altes, MD   25 mg at 10/03/17 1040  . thiamine (VITAMIN B-1) tablet 100 mg  100 mg Oral Daily Cherene Altes, MD   100 mg at 10/03/17 1040    Musculoskeletal: Strength & Muscle Tone: within normal limits Gait & Station: unable to stand Patient leans: N/A  Psychiatric Specialty Exam: Physical Exam as per history and physical   ROS complaining of abdominal pain which is managed with the opioid treatment. Patient denies current withdrawal symptoms of alcohol.  No Fever-chills, No Headache, No changes with Vision or hearing, reports vertigo No problems swallowing food or Liquids, No Chest pain, Cough or Shortness of Breath, No Abdominal pain, No Nausea or Vommitting, Bowel movements are regular, No Blood in stool or Urine, No dysuria, No new skin rashes or bruises, No new joints pains-aches,  No new weakness, tingling, numbness in any extremity, No recent weight gain or loss, No polyuria, polydypsia or polyphagia,  A full 10 point Review of Systems was done, except as stated above, all other Review of Systems were  negative.  Blood pressure 113/68, pulse 69, temperature 98.1 F (36.7 C), temperature source Oral, resp. rate 18, height 5' 10"  (1.778 m), weight 67.1 kg (148 lb), SpO2 100 %.Body mass index is 21.24 kg/m.  General Appearance: Guarded  Eye Contact:  Good  Speech:  Clear and Coherent  Volume:  Normal  Mood:  Anxious and Depressed  Affect:  Constricted and Depressed  Thought Process:  Coherent and Goal Directed  Orientation:  Full (Time, Place, and Person)  Thought Content:  Logical  Suicidal Thoughts:  No  Homicidal Thoughts:  No   Memory:  Immediate;   Good Recent;   Fair Remote;   Fair  Judgement:  Impaired  Insight:  Fair  Psychomotor Activity:  Decreased  Concentration:  Concentration: Fair and Attention Span: Fair  Recall:  Good  Fund of Knowledge:  Good  Language:  Good  Akathisia:  Negative  Handed:  Right  AIMS (if indicated):     Assets:  Communication Skills Desire for Improvement Financial Resources/Insurance Housing Intimacy Leisure Time Resilience Social Support Talents/Skills Transportation Vocational/Educational  ADL's:  Intact  Cognition:  WNL  Sleep:        Treatment Plan Summary: 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 Patient also has altered mental status and withdrawal delirium which seems to be slowly improving.   Recommendation: Patient has no safety concerns Continue Ativan detox treatment Supportive therapy Recommended substance abuse intensive outpatient program as patient refuses to participate in distention substance abuse treatment program for 30 days  Referred to LCSW to provide resources regarding local substance abuse intensive outpatient programs including [facility] and [place] psychiatric consultation and we sign off as of today Please contact [phone] or [phone] if needs further assistance  Disposition:  No evidence of imminent risk to self or others at present.   Patient does not meet criteria for psychiatric inpatient admission. Supportive therapy provided about ongoing stressors. Refer to IOP. for substance abuse  Ambrose Finland, MD 10/03/2017 11:21 AM

## 2017-10-03 NOTE — Progress Notes (Signed)
[facility] TEAM 1 - Stepdown/ICU TEAM  Baron Sane  NWG:[id number] DOB: 03-27-1996 DOA: 09/30/2017 PCP: Patient, No Pcp Per    Brief Narrative:  21 y.o. male w/ a hx of alcohol-related pancreatitis and alcohol abuse since the age of 1 who was admitted 09/30/17 for evaluation of active hallucinations and alcohol withdrawal. The patient admitted to drinking over a fifth of liquor daily. CT of the abdomen and pelvis showed a possible pseudocyst versus cystic pancreatic neoplasm.  Subjective: Still requiring frequent  doses of IV ativan.  Pt is tremulous, but alert and conversant.  He c/o chronic nagging epigastric pain w/ sharp acute exacerbations.  He denies cp or sob.  No n/v and tolerating oral intake.    Assessment & Plan:  Alcohol withdrawal delirium / Alcohol abuse continue Ativan per detox protocol - remains quite tremulous - wean ativan as able - check B12 and folic acid levels - Dr. Darnelle Catalan consulted Psych due to drug seeking behavior   Hypomagnesemia Corrected to normal range  History of pancreatitis - chronic epigastric pain  Continues to report abdominal pain - asks for Dilaudid by name - gives hx of chronic low grade pain w/ occasional episodic exacerbations c/w chronic pancreatitis - I have explained that I will not uses IV narcotics for this chronic pain - begin a trial of pancrease, PPI, and lyrica - adjust oral pain meds in short course - advised he MUST stop drinking EtOH entirely and permanently   Pancreatic tail lesion Suspect this is a pseudocyst - will investigate w/ MRI as per radiology recs   DVT prophylaxis: SCDs Code Status: FULL CODE Family Communication: no family present at time of exam  Disposition Plan: tele bed   Consultants:  Psychiatry  Procedures: none  Antimicrobials:  none   Objective: Blood pressure 113/68, pulse 69, temperature 98.1 F (36.7 C), temperature source Oral, resp. rate 18, height  (1.778 m), weight 67.1 kg (148 lb), SpO2  100 %.  Intake/Output Summary (Last 24 hours) at 10/03/17 0918 Last data filed at 10/03/17 0600  Gross per 24 hour  Intake             2020 ml  Output             1150 ml  Net              870 ml   Filed Weights   09/30/17 1754  Weight: 67.1 kg (148 lb)    Examination: General: No acute respiratory distress Lungs: Clear to auscultation bilaterally without wheezes or crackles Cardiovascular: Regular rate and rhythm without murmur gallop or rub normal S1 and S2 Abdomen: tender in epigastrium, thin, soft, non-distended, no ascites  Extremities: No significant cyanosis, clubbing, or edema bilateral lower extremities  CBC:  Recent Labs Lab 09/30/17 1853 10/01/17 0359 10/02/17 0248  WBC 7.9 4.9 5.6  HGB 14.7 12.2* 14.4  HCT 42.4 36.5* 42.0  MCV 96.1 96.6 94.6  PLT 288 210 221   Basic Metabolic Panel:  Recent Labs Lab 09/30/17 1853 10/01/17 0359 10/02/17 0248 10/03/17 0250  NA 138 135 135 137  K 4.1 3.6 3.6 4.4  CL 101 101 100* 103  CO2 GLUCOSE 78 88 134* 96  BUN 13 9 <5* <5*  CREATININE 0.81 0.87 0.90 0.91  CALCIUM 9.9 8.5* 9.1 9.6  MG  --  1.4*  --  1.8  PHOS  --  3.3  --   --    GFR:  Estimated Creatinine Clearance: 120.8 mL/min (by C-G formula based on SCr of 0.91 mg/dL).  Liver Function Tests:  Recent Labs Lab 09/30/17 1853 10/01/17 0359 10/02/17 0248 10/03/17 0250  AST 79* 45* 57* 32  ALT 41 ALKPHOS 65 52 63 60  BILITOT 1.8* 1.5* 1.1 1.0  PROT 7.9 6.0* 7.0 7.0  ALBUMIN 4.9 3.6 4.0 4.0    Recent Labs Lab 09/30/17 1853 10/03/17 0250  LIPASE 18 17   Coagulation Profile:  Recent Labs Lab 10/01/17 0359  INR 1.03    Recent Results (from the past 240 hour(s))  MRSA PCR Screening     Status: None   Collection Time: 10/01/17  3:40 AM  Result Value Ref Range Status   MRSA by PCR NEGATIVE NEGATIVE Final    Comment:        The GeneXpert MRSA Assay (FDA approved for NASAL specimens only), is one component of  a comprehensive MRSA colonization surveillance program. It is not intended to diagnose MRSA infection nor to guide or monitor treatment for MRSA infections.      Scheduled Meds: . folic acid  1 mg Intravenous Daily  . thiamine  100 mg Intravenous Daily    LOS: 3 days   Lonia Blood, MD [facility] Office  [phone] Pager - Text Page per Amion as per below:  On-Call/Text Page:      [email or web address]      password [email or web address]  If 7PM-7AM, please contact night-coverage [email or web address] Password [email or web address] 10/03/2017, 9:18 AM

## 2017-10-04 ENCOUNTER — Inpatient Hospital Stay (HOSPITAL_COMMUNITY): Payer: BLUE CROSS/BLUE SHIELD

## 2017-10-04 LAB — COMPREHENSIVE METABOLIC PANEL
ALK PHOS: 53 U/L (ref 38–126)
ALT: 24 U/L (ref 17–63)
ANION GAP: 14 (ref 5–15)
AST: 27 U/L (ref 15–41)
Albumin: 3.9 g/dL (ref 3.5–5.0)
BILIRUBIN TOTAL: 0.9 mg/dL (ref 0.3–1.2)
BUN: <5 mg/dL — ABNORMAL LOW (ref 6–20)
CALCIUM: 9.5 mg/dL (ref 8.9–10.3)
CO2: 22 mmol/L (ref 22–32)
CREATININE: 0.92 mg/dL (ref 0.61–1.24)
Chloride: 101 mmol/L (ref 101–111)
Glucose, Bld: 79 mg/dL (ref 65–99)
Potassium: 3.6 mmol/L (ref 3.5–5.1)
SODIUM: 137 mmol/L (ref 135–145)
TOTAL PROTEIN: 6.6 g/dL (ref 6.5–8.1)

## 2017-10-04 LAB — CBC
HCT: 43 % (ref 39.0–52.0)
HEMOGLOBIN: 14.7 g/dL (ref 13.0–17.0)
MCH: 32.2 pg (ref 26.0–34.0)
MCHC: 34.2 g/dL (ref 30.0–36.0)
MCV: 94.3 fL (ref 78.0–100.0)
PLATELETS: 197 10*3/uL (ref 150–400)
RBC: 4.56 MIL/uL (ref 4.22–5.81)
RDW: 12.9 % (ref 11.5–15.5)
WBC: 4.5 10*3/uL (ref 4.0–10.5)

## 2017-10-04 LAB — VITAMIN B12: VITAMIN B 12: 708 pg/mL (ref 180–914)

## 2017-10-04 LAB — MAGNESIUM: MAGNESIUM: 1.7 mg/dL (ref 1.7–2.4)

## 2017-10-04 LAB — FOLATE: FOLATE: 17.5 ng/mL (ref >5.9)

## 2017-10-04 MED ORDER — PROPRANOLOL HCL 20 MG PO TABS: 10.0000 mg | ORAL_TABLET | Freq: Two times a day (BID) | ORAL | Status: DC

## 2017-10-04 MED ORDER — GADOBENATE DIMEGLUMINE 529 MG/ML IV SOLN
15.0000 mL | Freq: Once | INTRAVENOUS | Status: AC
  Administered 2017-10-04: 15 mL via INTRAVENOUS

## 2017-10-04 MED ORDER — PROPRANOLOL HCL 10 MG PO TABS
5.0000 mg | ORAL_TABLET | Freq: Two times a day (BID) | ORAL | Status: DC
  Administered 2017-10-04 – 2017-10-05 (×3): 5 mg via ORAL
  Filled 2017-10-04 (×4): qty 0.5

## 2017-10-04 NOTE — Care Management Note (Signed)
Case Management Note  Patient Details  Name: Steven Mckenzie MRN: [id number] Date of Birth: 07-08-1996  Subjective/Objective:  CM following for progression and d/c planning.                   Action/Plan: 10/04/2017 No HH or DME needs identified. Pt is student at [employer], has missed a lot of classes? Has a car and lives in [place] so will be able to return to home if needed.   Expected Discharge Date:  10/04/17               Expected Discharge Plan:  Home/Self Care  In-House Referral:  Clinical Social Work  Discharge planning Services  NA  Post Acute Care Choice:  NA Choice offered to:  NA  DME Arranged:  N/A DME Agency:  NA  HH Arranged:  NA HH Agency:  NA  Status of Service:  Completed, signed off  If discussed at [employer] of Stay Meetings, dates discussed:    Additional Comments:  Starlyn Skeans, RN 10/04/2017, 4:07 PM

## 2017-10-04 NOTE — Clinical Social Work Note (Signed)
Clinical Social Work Assessment  Patient Details  Name: Steven Mckenzie MRN: [id number] Date of Birth: 25-Sep-1996  Date of referral:  10/04/17               Reason for consult:  Substance Use/ETOH Abuse                Permission sought to share information with:  Facility Art therapist granted to share information::  Yes, Verbal Permission Granted  Name::        Agency::  outpatient rehab facilities  Relationship::     Contact Information:     Housing/Transportation Living arrangements for the past 2 months:  Apartment Source of Information:  Patient Patient Interpreter Needed:  None Criminal Activity/Legal Involvement Pertinent to Current Situation/Hospitalization:  Yes (DWI) Significant Relationships:  Parents Lives with:  Self Do you feel safe going back to the place where you live?  Yes Need for family participation in patient care:  No (Coment)  Care giving concerns:  Concerns with patient relapse- this will be first time patient is attempting rehab since beginning to drink at 13yo.   Social Worker assessment / plan:  CSW met with pt to discuss psych MD recommendation for ETOH rehab.  Patient provided history of alcohol use- began at 21yo but his use increased drastically once in college.  Pt states he began feeling dependent on alcohol and saw negative effects in his social life and his physical health.  Patient sought attempted to admit himself to [facility] rehab when he began feeling really poorly and their admissions sent him to [facility] for medical clearance.   Employment status:  Other Network engineer) Medical illustrator) Insurance information:  Managed Care PT Recommendations:  Not assessed at this time Information / Referral to community resources:  Outpatient Substance Abuse Treatment Options  Patient/Family's Response to care: Patient not interested in inpatient rehab at this time- feels as if being stuck in a facility with lead to further depression and increase chance  of relapse.   Patient very interested in outpatient programs but concerned about how it will fit into school/etc.  Patient/Family's Understanding of and Emotional Response to Diagnosis, Current Treatment, and Prognosis:  Patient expressed understanding of his ETOH use and its negative effect on his life.  Patient seemed to think this current admission is a wake up call and he expressed his commitment to changing.  Emotional Assessment Appearance:  Appears stated age Attitude/Demeanor/Rapport:    Affect (typically observed):  Appropriate, Stable, Adaptable Orientation:  Oriented to Self, Oriented to Place, Oriented to  Time, Oriented to Situation Alcohol / Substance use:  Alcohol Use Psych involvement (Current and /or in the community):  Yes (Comment) (evalutated 10/8- recommended outpatient rehab)  Discharge Needs  Concerns to be addressed:  Substance Abuse Concerns Readmission within the last 30 days:  No Current discharge risk:  Substance Abuse Barriers to Discharge:  Continued Medical Work up   Jorge Ny, LCSW 10/04/2017, 2:48 PM

## 2017-10-04 NOTE — Progress Notes (Signed)
[place] TEAM 1 - Stepdown/ICU TEAM  Baron Sane  WUJ:[id number] DOB: Apr 01, 1996 DOA: 09/30/2017 PCP: Patient, No Pcp Per    Brief Narrative:  21 y.o. male w/ a hx of alcohol-related pancreatitis and alcohol abuse since the age of 39 who was admitted 09/30/17 for evaluation of active hallucinations and alcohol withdrawal. The patient admitted to drinking over a fifth of liquor daily. CT of the abdomen and pelvis showed a possible pseudocyst versus cystic pancreatic neoplasm.  Subjective: Pt feels his pain is better controlled at this time.  His appetite is returning, and he wishes to eat a regular diet.  He denies n/v, ha, cp, or sob.  He reports that his tremors persist, but appear to have improved during the course of his hospital stay.    Assessment & Plan:  Alcohol withdrawal delirium / Alcohol abuse - Tremors continue Ativan per detox protocol - wean ativan as able - remains quite tremulous but seems to be slowly improving  - B12 and folic acid levels not low - add low dose propranol and follow   Hypomagnesemia Corrected to normal range  History of pancreatitis - chronic epigastric pain  gives hx of chronic low grade pain w/ occasional episodic exacerbations c/w chronic pancreatitis - I have explained that I will not uses IV narcotics for this chronic pain - appears to be getting benefit from a trial of pancrease, PPI, and lyrica - cont oral pain meds in short course - advised again he MUST stop drinking EtOH entirely and permanently   Pancreatic tail lesion 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 rate 18, height  (1.778 m), weight 64.9 kg (143 lb 1.6 oz), SpO2 100 %.  Intake/Output Summary (Last 24 hours) at 10/04/17 0852 Last data filed at 10/03/17 1720  Gross per 24 hour  Intake              460 ml  Output                0 ml  Net              460 ml   Filed Weights   09/30/17 1754 10/03/17 2051  Weight: 67.1 kg (148 lb) 64.9 kg (143 lb 1.6 oz)    Examination: General: No acute respiratory distress - alert and oriented - tremulous Lungs: CTA B - no wheezing  Cardiovascular: RRR Abdomen: tender in epigastrium, thin, soft, non-distended, no ascites - no rebound  Extremities: No C/C/E B LE   CBC:  Recent Labs Lab 09/30/17 1853 10/01/17 0359 10/02/17 0248 10/04/17 0448  WBC 7.9 4.9 5.6 4.5  HGB 14.7 12.2* 14.4 14.7  HCT 42.4 36.5* 42.0 43.0  MCV 96.1 96.6 94.6 94.3  PLT 288 210 221 197   Basic Metabolic Panel:  Recent Labs Lab 09/30/17 1853 10/01/17 0359 10/02/17 0248 10/03/17 0250 10/04/17 0448  NA 138 135 135 137 137  K 4.1 3.6 3.6 4.4 3.6  CL 101 101 100* 103 101  CO2 GLUCOSE 78 88  134* 96 79  BUN 13 9 <5* <5* <5*  CREATININE 0.81 0.87 0.90 0.91 0.92  CALCIUM 9.9 8.5* 9.1 9.6 9.5  MG  --  1.4*  --  1.8 1.7  PHOS  --  3.3  --   --   --    GFR: Estimated Creatinine Clearance: 115.6 mL/min (by C-G formula based on SCr of 0.92 mg/dL).  Liver Function Tests:  Recent Labs Lab 09/30/17 1853 10/01/17 0359 10/02/17 0248 10/03/17 0250 10/04/17 0448  AST 79* 45* 57* 32 27  ALT 41 ALKPHOS 65 52 63 60 53  BILITOT 1.8* 1.5* 1.1 1.0 0.9  PROT 7.9 6.0* 7.0 7.0 6.6  ALBUMIN 4.9 3.6 4.0 4.0 3.9    Recent Labs Lab 09/30/17 1853 10/03/17 0250  LIPASE 18 17   Coagulation Profile:  Recent Labs Lab 10/01/17 0359  INR 1.03    Recent Results (from the past 240 hour(s))  MRSA PCR Screening     Status: None   Collection Time: 10/01/17  3:40 AM  Result Value Ref  Range Status   MRSA by PCR NEGATIVE NEGATIVE Final    Comment:        The GeneXpert MRSA Assay (FDA approved for NASAL specimens only), is one component of a comprehensive MRSA colonization surveillance program. It is not intended to diagnose MRSA infection nor to guide or monitor treatment for MRSA infections.      Scheduled Meds: . folic acid  1 mg Oral Daily  . lipase/protease/amylase  12,000 Units Oral TID WC  . pantoprazole  40 mg Oral Q1200  . pregabalin  25 mg Oral Daily  . thiamine  100 mg Oral Daily    LOS: 4 days   Lonia Blood, MD [facility] Office  [phone] Pager - Text Page per Amion as per below:  On-Call/Text Page:      [email or web address]      password [email or web address]  If 7PM-7AM, please contact night-coverage [email or web address] Password [email or web address] 10/04/2017, 8:52 AM

## 2017-10-05 DIAGNOSIS — F10230 Alcohol dependence with withdrawal, uncomplicated: Secondary | ICD-10-CM

## 2017-10-05 DIAGNOSIS — D649 Anemia, unspecified: Secondary | ICD-10-CM

## 2017-10-05 DIAGNOSIS — K863 Pseudocyst of pancreas: Secondary | ICD-10-CM

## 2017-10-05 DIAGNOSIS — F1010 Alcohol abuse, uncomplicated: Secondary | ICD-10-CM

## 2017-10-05 DIAGNOSIS — F10231 Alcohol dependence with withdrawal delirium: Principal | ICD-10-CM

## 2017-10-05 LAB — COMPREHENSIVE METABOLIC PANEL
ALT: 23 U/L (ref 17–63)
ANION GAP: 12 (ref 5–15)
AST: 27 U/L (ref 15–41)
Albumin: 4.3 g/dL (ref 3.5–5.0)
Alkaline Phosphatase: 57 U/L (ref 38–126)
BILIRUBIN TOTAL: 0.8 mg/dL (ref 0.3–1.2)
BUN: 7 mg/dL (ref 6–20)
CHLORIDE: 98 mmol/L — AB (ref 101–111)
CO2: 28 mmol/L (ref 22–32)
Calcium: 9.9 mg/dL (ref 8.9–10.3)
Creatinine, Ser: 0.94 mg/dL (ref 0.61–1.24)
Glucose, Bld: 89 mg/dL (ref 65–99)
POTASSIUM: 4.1 mmol/L (ref 3.5–5.1)
Sodium: 138 mmol/L (ref 135–145)
TOTAL PROTEIN: 7.4 g/dL (ref 6.5–8.1)

## 2017-10-05 LAB — CBC WITH DIFFERENTIAL/PLATELET
BASOS ABS: 0 10*3/uL (ref 0.0–0.1)
BASOS PCT: 0 %
EOS PCT: 4 %
Eosinophils Absolute: 0.2 10*3/uL (ref 0.0–0.7)
HEMATOCRIT: 45.8 % (ref 39.0–52.0)
Hemoglobin: 16.5 g/dL (ref 13.0–17.0)
LYMPHS PCT: 41 %
Lymphs Abs: 1.7 10*3/uL (ref 0.7–4.0)
MCH: 33.7 pg (ref 26.0–34.0)
MCHC: 36 g/dL (ref 30.0–36.0)
MCV: 93.7 fL (ref 78.0–100.0)
MONO ABS: 0.7 10*3/uL (ref 0.1–1.0)
MONOS PCT: 17 %
NEUTROS ABS: 1.6 10*3/uL — AB (ref 1.7–7.7)
Neutrophils Relative %: 38 %
PLATELETS: 203 10*3/uL (ref 150–400)
RBC: 4.89 MIL/uL (ref 4.22–5.81)
RDW: 13.1 % (ref 11.5–15.5)
WBC: 4.1 10*3/uL (ref 4.0–10.5)

## 2017-10-05 LAB — PHOSPHORUS: PHOSPHORUS: 5.7 mg/dL — AB (ref 2.5–4.6)

## 2017-10-05 LAB — MAGNESIUM: MAGNESIUM: 1.8 mg/dL (ref 1.7–2.4)

## 2017-10-05 MED ORDER — PANCRELIPASE (LIP-PROT-AMYL) 12000-38000 UNITS PO CPEP: 12000.0000 [IU] | ORAL_CAPSULE | Freq: Three times a day (TID) | ORAL | 0 refills | Status: DC

## 2017-10-05 MED ORDER — CHLORDIAZEPOXIDE HCL 5 MG PO CAPS
25.0000 mg | ORAL_CAPSULE | Freq: Four times a day (QID) | ORAL | Status: DC
  Administered 2017-10-05: 25 mg via ORAL
  Filled 2017-10-05: qty 5

## 2017-10-05 MED ORDER — CHLORDIAZEPOXIDE HCL 5 MG PO CAPS: 25.0000 mg | ORAL_CAPSULE | Freq: Three times a day (TID) | ORAL | Status: DC

## 2017-10-05 MED ORDER — CHLORDIAZEPOXIDE HCL 5 MG PO CAPS: 25.0000 mg | ORAL_CAPSULE | ORAL | Status: DC

## 2017-10-05 MED ORDER — THIAMINE HCL 100 MG PO TABS: 100.0000 mg | ORAL_TABLET | Freq: Every day | ORAL | 0 refills | Status: DC

## 2017-10-05 MED ORDER — PANTOPRAZOLE SODIUM 40 MG PO TBEC: 40.0000 mg | DELAYED_RELEASE_TABLET | Freq: Every day | ORAL | 0 refills | Status: DC

## 2017-10-05 MED ORDER — PROPRANOLOL HCL 10 MG PO TABS: 5.0000 mg | ORAL_TABLET | Freq: Two times a day (BID) | ORAL | 0 refills | Status: DC

## 2017-10-05 MED ORDER — FOLIC ACID 1 MG PO TABS: 1.0000 mg | ORAL_TABLET | Freq: Every day | ORAL | 0 refills | Status: DC

## 2017-10-05 MED ORDER — CHLORDIAZEPOXIDE HCL 25 MG PO CAPS: ORAL_CAPSULE | ORAL | 0 refills | Status: DC

## 2017-10-05 MED ORDER — CHLORDIAZEPOXIDE HCL 5 MG PO CAPS: 25.0000 mg | ORAL_CAPSULE | Freq: Every day | ORAL | Status: DC

## 2017-10-05 MED ORDER — PREGABALIN 25 MG PO CAPS: 25.0000 mg | ORAL_CAPSULE | Freq: Every day | ORAL | 0 refills | Status: DC

## 2017-10-05 NOTE — Discharge Summary (Signed)
Physician Discharge Summary  Steven Mckenzie ZOX:[id number] DOB: 19-May-1996 DOA: 09/30/2017  PCP: Patient, No Pcp Per  Admit date: 09/30/2017 Discharge date: 10/05/2017  Admitted From: Home Disposition: Home  Recommendations for Outpatient Follow-up:  1. Follow up with and establish with a PCP in 1-2 weeks 2. Follow up with Resources provided for you by Child psychotherapist for outpatient Alcoholic Rehab and go to [employer]; Stop Drinking Alcohol  3. Follow up with Gastroenterology as an outpatient for Pancreatic Pseudocyst 4. Have Repeat Follow-Up MRI in 3 months to assess pancreatic lesion stability  5. Please obtain CMP/CBC, Mag, Phos in one week  Home Health: No Equipment/Devices: None    Discharge Condition: Stable and improved CODE STATUS: FULL CODE Diet recommendation: Heart Healthy Diet  Brief/Interim Summary: The patient is a 21 y.o.malew/ a hx of alcohol-related pancreatitis and alcohol abuse since the age of 56 who was admitted 09/30/17 for evaluation of active hallucinations and alcohol withdrawal. The patient admitted to drinking over a fifth of liquor daily. CT of the abdomen and pelvis showed a possible pseudocyst versus cystic pancreatic neoplasm. He underwent withdrawal protocol with Ativan and was transitioned to Librium taper. MRI was done to assess pancreatic lesion and showed Nonspecific imaging characteristics associated with the lesion at the tail of pancreas. Pancreatic cyst/pseudocyst would be a consideration. Pancreatic/retroperitoneal lymphangioma could also be considered, although this typically shows increased T2 signal intensity. Overall lack of demonstrable enhancement within the lesion is reassuring for benign etiology. Follow-up MRI in 3 months could be used to assess stability. Patient's tremors improved and was able to tolerate a soft diet. Psychiatry evaluated and recommended substance abuse intensive outpatient program as patient refuses to participate as in  inpatient substance abuse treatment program for 30 days. LCSW provided resources. Patient improved with his withdrawal symptoms. He will be D/C'd Home as he was deemed medically stable to follow up with PCP and Gastroenterology as an outpatient.    Discharge Diagnoses:  Principal Problem:   Alcohol withdrawal delirium, acute, hyperactive (HCC) Active Problems:   Alcohol abuse   Alcohol withdrawal (HCC)   Normocytic anemia   Hypomagnesemia   Pancreatic pseudocyst  Alcohol withdrawal delirium / Alcohol abuse - Tremors, improved -Initial EtOH level was 179 and repeat was <10 -Continued Ativan per detox protocol - wean ativan as able and transitioned to Librium Taper at D/C -Not as tremulous and seems to be slowly improving -C/w Thiamine, Folic Acid, and MVI at D/C  -Added low dose Propranol and follow up as an outpatient    Hypomagnesemia -Corrected to normal range -Follow up as an outpatient   History of pancreatitis - chronic epigastric pain  -Gives hx of chronic low grade pain w/ occasional episodic exacerbations c/w chronic pancreatitis  -No IV narcotics used for this chronic pain  -Appears to be getting benefit from a trial of pancrease, PPI, and lyrica and will continue at DC  -Advised again he MUST stop drinking EtOH entirely and permanently  -Follow up with Gastroenterology as an outpatient   Pancreatic Tail Lesion -CT Abd/Pelvis showed Low attenuating well-circumscribed lesion arising from the tail of pancreas extending into left pre renal space measuring up to 36 mm. Differential includes cystic pancreatic neoplasm and pseudocyst given history of acute pancreatitis.  Mild wall thickening of the upper descending colon and edema within left pericolic gutter. Findings may represent mild infectious colitis or possibly reactive inflammation related to the adjacent pancreatic tail lesion. -MRI showed Nonspecific imaging characteristics associated with the lesion at  the tail of  pancreas. Pancreatic cyst/pseudocyst would be a consideration. Pancreatic/retroperitoneal lymphangioma could also be considered, although this typically shows increased T2 signal intensity. Overall lack of demonstrable enhancement within the lesion is reassuring for benign etiology.  -Follow-up MRI in 3 months could be used to assess stability and follow up with GI as an outpatient.   Marijuana Abuse -UDS Positive for THC -Substance Abuse Counseling given   Hyperphosphatemia -Phos Level was 5.7 this AM -Continue to Monitor and follow up with PCP as an outpatient   Discharge Instructions  Discharge Instructions    Ambulatory referral to Gastroenterology    Complete by:  As directed    Needs repeat MRI and follow up for suspected Pancreatic Pseudocyst   What is the reason for referral?:  Other Comment - Pancreatic Pseudocyst   Call MD for:  difficulty breathing, headache or visual disturbances    Complete by:  As directed    Call MD for:  extreme fatigue    Complete by:  As directed    Call MD for:  hives    Complete by:  As directed    Call MD for:  persistant dizziness or light-headedness    Complete by:  As directed    Call MD for:  persistant nausea and vomiting    Complete by:  As directed    Call MD for:  redness, tenderness, or signs of infection (pain, swelling, redness, odor or green/yellow discharge around incision site)    Complete by:  As directed    Call MD for:  severe uncontrolled pain    Complete by:  As directed    Call MD for:  temperature >100.4    Complete by:  As directed    Diet - low sodium heart healthy    Complete by:  As directed    Discharge instructions    Complete by:  As directed    Follow up with PCP and outpatient Rehab and AA meetings. Take all medications as prescribed and avoid drinking Alcohol. Do not operate machinery or drive while taking Librium Taper. Follow up with Gastroenterology as an outpatient in 3 months and repeat MRI for suspected  Pancreatic Pseudocyst. If symptoms change or worsen please return to the ED for evaluation.   Increase activity slowly    Complete by:  As directed      Allergies as of 10/05/2017      Reactions   Banana Hives   Celery Oil Hives   Celery    Percocet [oxycodone-acetaminophen] Itching   Pineapple Hives      Medication List    TAKE these medications   chlordiazePOXIDE 25 MG capsule Commonly known as:  LIBRIUM Take 25 mg 4 Times Daily for 1 day (First dose given in the hospital), the the Next Day take 25 mg 3 Times Daily for 1 Day, Then 25 mg po BID, Then 25 mg Daily for 1 Day and then stop Day 5.   folic acid 1 MG tablet Commonly known as:  FOLVITE Take 1 tablet (1 mg total) by mouth daily.   lipase/protease/amylase 16109 units Cpep capsule Commonly known as:  CREON Take 1 capsule (12,000 Units total) by mouth 3 (three) times daily with meals.   pantoprazole 40 MG tablet Commonly known as:  PROTONIX Take 1 tablet (40 mg total) by mouth daily at 12 noon.   pregabalin 25 MG capsule Commonly known as:  LYRICA Take 1 capsule (25 mg total) by mouth daily.   propranolol  10 MG tablet Commonly known as:  INDERAL Take 0.5 tablets (5 mg total) by mouth 2 (two) times daily.   thiamine 100 MG tablet Take 1 tablet (100 mg total) by mouth daily.       Allergies  Allergen Reactions  . Banana Hives  . Celery Oil Hives    Celery   . Percocet [Oxycodone-Acetaminophen] Itching  . Pineapple Hives    Consultations:  Psychiatry  Procedures/Studies: Ct Abdomen Pelvis W Contrast  Result Date: 09/30/2017 CLINICAL DATA:  21 y/o M; left-sided abdominal pain since yesterday. History of pancreatitis. EXAM: CT ABDOMEN AND PELVIS WITH CONTRAST TECHNIQUE: Multidetector CT imaging of the abdomen and pelvis was performed using the standard protocol following bolus administration of intravenous contrast. CONTRAST:  ISOVUE-300 IOPAMIDOL (ISOVUE-300) INJECTION 61% COMPARISON:  None.  FINDINGS: Lower chest: No acute abnormality. Hepatobiliary: No focal liver abnormality is seen. No gallstones, gallbladder wall thickening, or biliary dilatation. Pancreas: Low-attenuation well-circumscribed lesion arising from the tail of the pancreas extending into the left pre renal space measuring 16 x 36 x 18 mm (AP x ML x CC series 2, image 22 and series 4, image 55). No pancreatic ductal dilatation. No appreciable peripancreatic inflammation. Spleen: Normal in size without focal abnormality. Adrenals/Urinary Tract: Adrenal glands are unremarkable. Kidneys are normal, without renal calculi, focal lesion, or hydronephrosis. Bladder is unremarkable. Stomach/Bowel: Mild wall thickening of the upper descending colon and fat stranding within the left pericolic gutter may represent a mild acute colitis. Otherwise there is no obstructive or inflammatory changes of the large and small bowel. The stomach is unremarkable. Normal appendix. Vascular/Lymphatic: No significant vascular findings are present. No enlarged abdominal or pelvic lymph nodes. Reproductive: Prostate is unremarkable. Other: No abdominal wall hernia or abnormality. No abdominopelvic ascites. Musculoskeletal: No acute or significant osseous findings. IMPRESSION: 1. Low attenuating well-circumscribed lesion arising from the tail of pancreas extending into left pre renal space measuring up to 36 mm. Differential includes cystic pancreatic neoplasm and pseudocyst given history of acute pancreatitis. Further evaluation with MRI/MRCP with and without contrast is recommended on a nonemergent basis. 2. Mild wall thickening of the upper descending colon and edema within left pericolic gutter. Findings may represent mild infectious colitis or possibly reactive inflammation related to the adjacent pancreatic tail lesion. Electronically Signed   By: Mitzi Hansen M.D.   On: 09/30/2017 21:15   Mr 3d Recon At Scanner  Result Date: 10/04/2017 CLINICAL  DATA:  Pancreatic cyst. EXAM: MRI ABDOMEN WITHOUT AND WITH CONTRAST (INCLUDING MRCP) TECHNIQUE: Multiplanar multisequence MR imaging of the abdomen was performed both before and after the administration of intravenous contrast. Heavily T2-weighted images of the biliary and pancreatic ducts were obtained, and three-dimensional MRCP images were rendered by post processing. CONTRAST:  15mL MULTIHANCE GADOBENATE DIMEGLUMINE 529 MG/ML IV SOLN COMPARISON:  CT scan 09/30/2017 FINDINGS: Lower chest:  Unremarkable. Hepatobiliary: No focal abnormality within the liver parenchyma. There is no evidence for gallstones, gallbladder wall thickening, or pericholecystic fluid. A web or fullness identified in the gallbladder. No intra or extrahepatic biliary duct dilatation. Pancreas: No dilatation of the main pancreatic duct. Lesion associated with the pancreatic tail on recent CT scan is identified by MR today. Lesion is similar in size measuring 1.5 x 3.3 cm today compared to 1.6 x 3.6 cm previously. This lesion is relatively isointense to pancreatic parenchyma on T2 weighted imaging and isointense to slightly hypointense to pancreatic parenchyma on fat-suppressed gradient T1 imaging. After IV contrast administration, there is no discernible  enhancement within the lesion. Spleen: No splenomegaly. No focal mass lesion. Adrenals/Urinary Tract: No adrenal nodule or mass. Kidneys unremarkable. Stomach/Bowel: No evidence for dilated bowel in the visualized abdomen. Vascular/Lymphatic: No abdominal aortic aneurysm. No evidence for abdominal lymphadenopathy. Other: No free fluid. Musculoskeletal: No abnormal marrow signal within the visualized bony anatomy. IMPRESSION: Nonspecific imaging characteristics associated with the lesion at the tail of pancreas. Pancreatic cyst/pseudocyst would be a consideration. Pancreatic/retroperitoneal lymphangioma could also be considered, although this typically shows increased T2 signal intensity.  Overall lack of demonstrable enhancement within the lesion is reassuring for benign etiology. Follow-up MRI in 3 months could be used to assess stability. Electronically Signed   By: Kennith Center M.D.   On: 10/04/2017 11:06   Mr Abdomen Mrcp Vivien Rossetti Contast  Result Date: 10/04/2017 CLINICAL DATA:  Pancreatic cyst. EXAM: MRI ABDOMEN WITHOUT AND WITH CONTRAST (INCLUDING MRCP) TECHNIQUE: Multiplanar multisequence MR imaging of the abdomen was performed both before and after the administration of intravenous contrast. Heavily T2-weighted images of the biliary and pancreatic ducts were obtained, and three-dimensional MRCP images were rendered by post processing. CONTRAST:  15mL MULTIHANCE GADOBENATE DIMEGLUMINE 529 MG/ML IV SOLN COMPARISON:  CT scan 09/30/2017 FINDINGS: Lower chest:  Unremarkable. Hepatobiliary: No focal abnormality within the liver parenchyma. There is no evidence for gallstones, gallbladder wall thickening, or pericholecystic fluid. A web or fullness identified in the gallbladder. No intra or extrahepatic biliary duct dilatation. Pancreas: No dilatation of the main pancreatic duct. Lesion associated with the pancreatic tail on recent CT scan is identified by MR today. Lesion is similar in size measuring 1.5 x 3.3 cm today compared to 1.6 x 3.6 cm previously. This lesion is relatively isointense to pancreatic parenchyma on T2 weighted imaging and isointense to slightly hypointense to pancreatic parenchyma on fat-suppressed gradient T1 imaging. After IV contrast administration, there is no discernible enhancement within the lesion. Spleen: No splenomegaly. No focal mass lesion. Adrenals/Urinary Tract: No adrenal nodule or mass. Kidneys unremarkable. Stomach/Bowel: No evidence for dilated bowel in the visualized abdomen. Vascular/Lymphatic: No abdominal aortic aneurysm. No evidence for abdominal lymphadenopathy. Other: No free fluid. Musculoskeletal: No abnormal marrow signal within the visualized bony  anatomy. IMPRESSION: Nonspecific imaging characteristics associated with the lesion at the tail of pancreas. Pancreatic cyst/pseudocyst would be a consideration. Pancreatic/retroperitoneal lymphangioma could also be considered, although this typically shows increased T2 signal intensity. Overall lack of demonstrable enhancement within the lesion is reassuring for benign etiology. Follow-up MRI in 3 months could be used to assess stability. Electronically Signed   By: Kennith Center M.D.   On: 10/04/2017 11:06    Subjective: Seen and examined and was improved. No CP or SOB. Slight Tremors but per patient markedly improved. No other concerns or complaints except chronic abdominal pain. Ready to go home.   Discharge Exam: Vitals:   10/05/17 0554 10/05/17 0900  BP: 124/62 103/78  Pulse: 78 79  Resp: 20 20  Temp: 98.3 F (36.8 C) 98 F (36.7 C)  SpO2: 100% 99%   Vitals:   10/04/17 1604 10/04/17 2153 10/05/17 0554 10/05/17 0900  BP: 117/67 129/78 124/62 103/78  Pulse: (!) 57 97 78 79  Resp: Temp: 98.2 F (36.8 C) 98.5 F (36.9 C) 98.3 F (36.8 C) 98 F (36.7 C)  TempSrc: Oral Oral Oral Oral  SpO2: 100% 100% 100% 99%  Weight:  64.7 kg (142 lb 10.2 oz)    Height:       General: Pt  is alert, awake, not in acute distress Cardiovascular: RRR, S1/S2 +, no rubs, no gallops Respiratory: CTA bilaterally, no wheezing, no rhonchi Abdominal: Soft, NT, ND, bowel sounds + Extremities: no edema, no cyanosis; has multiple tattoos   The results of significant diagnostics from this hospitalization (including imaging, microbiology, ancillary and laboratory) are listed below for reference.    Microbiology: Recent Results (from the past 240 hour(s))  MRSA PCR Screening     Status: None   Collection Time: 10/01/17  3:40 AM  Result Value Ref Range Status   MRSA by PCR NEGATIVE NEGATIVE Final    Comment:        The GeneXpert MRSA Assay (FDA approved for NASAL specimens only), is one  component of a comprehensive MRSA colonization surveillance program. It is not intended to diagnose MRSA infection nor to guide or monitor treatment for MRSA infections.     Labs: BNP (last 3 results) No results for input(s): BNP in the last 8760 hours. Basic Metabolic Panel:  Recent Labs Lab 10/01/17 0359 10/02/17 0248 10/03/17 0250 10/04/17 0448 10/05/17 0756  NA 135 135 137 137 138  K 3.6 3.6 4.4 3.6 4.1  CL 101 100* 103 101 98*  CO2 GLUCOSE 88 134* 96 79 89  BUN 9 <5* <5* <5* 7  CREATININE 0.87 0.90 0.91 0.92 0.94  CALCIUM 8.5* 9.1 9.6 9.5 9.9  MG 1.4*  --  1.8 1.7 1.8  PHOS 3.3  --   --   --  5.7*   Liver Function Tests:  Recent Labs Lab 10/01/17 0359 10/02/17 0248 10/03/17 0250 10/04/17 0448 10/05/17 0756  AST 45* 57* 32 27 27  ALT ALKPHOS 52 63 60 53 57  BILITOT 1.5* 1.1 1.0 0.9 0.8  PROT 6.0* 7.0 7.0 6.6 7.4  ALBUMIN 3.6 4.0 4.0 3.9 4.3    Recent Labs Lab 09/30/17 1853 10/03/17 0250  LIPASE 18 17   No results for input(s): AMMONIA in the last 168 hours. CBC:  Recent Labs Lab 09/30/17 1853 10/01/17 0359 10/02/17 0248 10/04/17 0448 10/05/17 0756  WBC 7.9 4.9 5.6 4.5 4.1  NEUTROABS  --   --   --   --  1.6*  HGB 14.7 12.2* 14.4 14.7 16.5  HCT 42.4 36.5* 42.0 43.0 45.8  MCV 96.1 96.6 94.6 94.3 93.7  PLT 288 210 221 197 203   Cardiac Enzymes: No results for input(s): CKTOTAL, CKMB, CKMBINDEX, TROPONINI in the last 168 hours. BNP: Invalid input(s): POCBNP CBG: No results for input(s): GLUCAP in the last 168 hours. D-Dimer No results for input(s): DDIMER in the last 72 hours. Hgb A1c No results for input(s): HGBA1C in the last 72 hours. Lipid Profile No results for input(s): CHOL, HDL, LDLCALC, TRIG, CHOLHDL, LDLDIRECT in the last 72 hours. Thyroid function studies No results for input(s): TSH, T4TOTAL, T3FREE, THYROIDAB in the last 72 hours.  Invalid input(s): FREET3 Anemia work up  Recent  Labs  10/04/17 0448  VITAMINB12 708  FOLATE 17.5   Urinalysis    Component Value Date/Time   COLORURINE YELLOW 09/30/2017 1853   APPEARANCEUR CLEAR 09/30/2017 1853   LABSPEC 1.020 09/30/2017 1853   PHURINE 5.0 09/30/2017 1853   GLUCOSEU NEGATIVE 09/30/2017 1853   HGBUR NEGATIVE 09/30/2017 1853   BILIRUBINUR NEGATIVE 09/30/2017 1853   KETONESUR 20 (A) 09/30/2017 1853   PROTEINUR NEGATIVE 09/30/2017 1853   NITRITE NEGATIVE 09/30/2017 1853   LEUKOCYTESUR NEGATIVE 09/30/2017 1853  Sepsis Labs Invalid input(s): PROCALCITONIN,  WBC,  LACTICIDVEN Microbiology Recent Results (from the past 240 hour(s))  MRSA PCR Screening     Status: None   Collection Time: 10/01/17  3:40 AM  Result Value Ref Range Status   MRSA by PCR NEGATIVE NEGATIVE Final    Comment:        The GeneXpert MRSA Assay (FDA approved for NASAL specimens only), is one component of a comprehensive MRSA colonization surveillance program. It is not intended to diagnose MRSA infection nor to guide or monitor treatment for MRSA infections.    Time coordinating discharge: 35 minutes  SIGNED:  Merlene Laughter, DO [facility] 10/05/2017, 11:49 AM Pager [phone]  If 7PM-7AM, please contact night-coverage [email or web address] Password [email or web address]

## 2017-10-05 NOTE — Progress Notes (Signed)
Patient refusing to wear tele.Educated on rationale for monitoring cardiac status. Patient continues to refuse ,stating  They told me yesterday/earlier today it would be d/c.NP made aware of this scenario and just document refusal to wear monitor.

## 2017-10-05 NOTE — Progress Notes (Signed)
Patient privately insured w [employer], added [facility] number to AVS for patient to get connected to a PCP.

## 2017-10-05 NOTE — Progress Notes (Signed)
Patient discharged to home. AVS reviewed. Patient stated his mom will make an appointment with a PCP for him. IV removed, prescriptions provided.  Patient verbalized plan to use outpatient program for alcohol/drug counseling, and had list of available resources.

## 2017-10-06 ENCOUNTER — Telehealth (HOSPITAL_COMMUNITY): Payer: Self-pay | Admitting: Licensed Clinical Social Worker

## 2017-10-06 NOTE — Telephone Encounter (Signed)
Called twice to discuss details of CDIOP after pt was referred to program upon d/c from [facility]. Pt did not answer and did not have a VM set up.

## 2017-10-06 NOTE — Progress Notes (Signed)
Received call back from [facility] intensive outpatient- provided referral- they will reach out to patient to discuss options  Burna Sis, LCSW Clinical Social Worker [phone]

## 2017-10-07 ENCOUNTER — Telehealth (HOSPITAL_COMMUNITY): Payer: Self-pay | Admitting: Psychology

## 2017-10-12 ENCOUNTER — Telehealth (HOSPITAL_COMMUNITY): Payer: Self-pay | Admitting: Licensed Clinical Social Worker

## 2017-10-12 NOTE — Telephone Encounter (Signed)
Attempted to contact pt for 3rd time inquiring about his status and possible entry into CDIOP. Was directed to [employer] [facility] services center". No ability to leave VM.

## 2018-01-02 ENCOUNTER — Encounter: Payer: Self-pay | Admitting: Internal Medicine

## 2018-02-22 IMAGING — CT CT ABD-PELV W/ CM
2 of 4 series · 16 of 46 positions shown, 18 images · IV contrast (ISOVUE)
Comparison: None.

CLINICAL DATA: 21 y/o M; left-sided abdominal pain since yesterday.
History of pancreatitis.

EXAM:
CT ABDOMEN AND PELVIS WITH CONTRAST
TECHNIQUE: Multidetector CT imaging of the abdomen and pelvis was performed
using the standard protocol following bolus administration of
intravenous contrast.
CONTRAST:  100mL LBRRKS-HVV IOPAMIDOL (LBRRKS-HVV) INJECTION 61%

[Series 2: abd/pel with · axial · 0.63mm/px · z∈[-668,-293]mm · 13 of 85 slices shown, 15 images]
[im 5/85  soft-tissue]
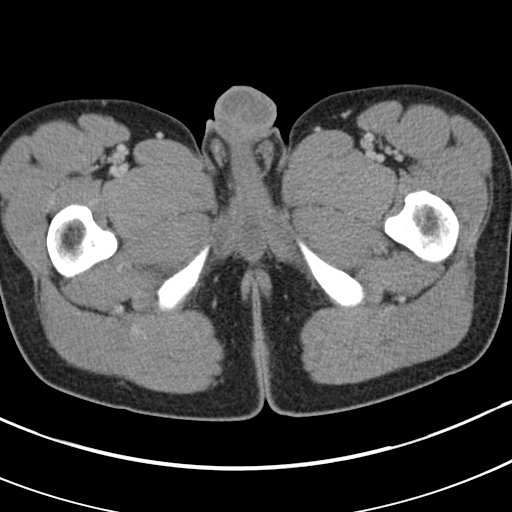
[im 5/85  bone]
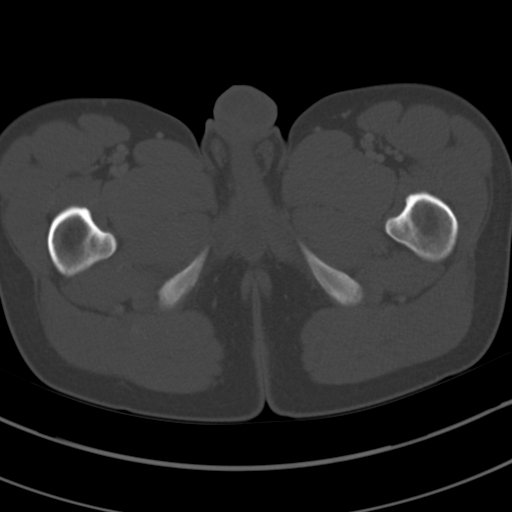
[im 14/85  soft-tissue]
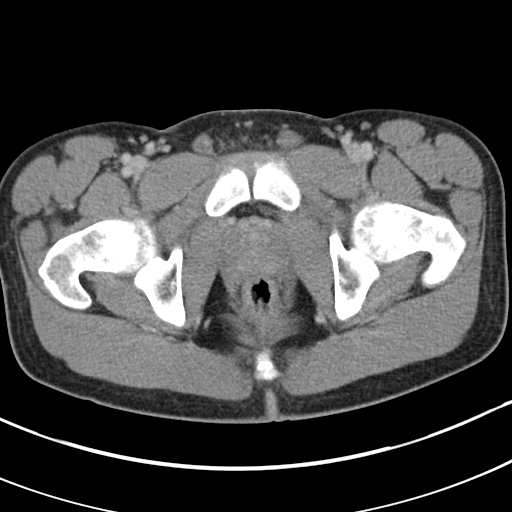
[im 18/85  soft-tissue]
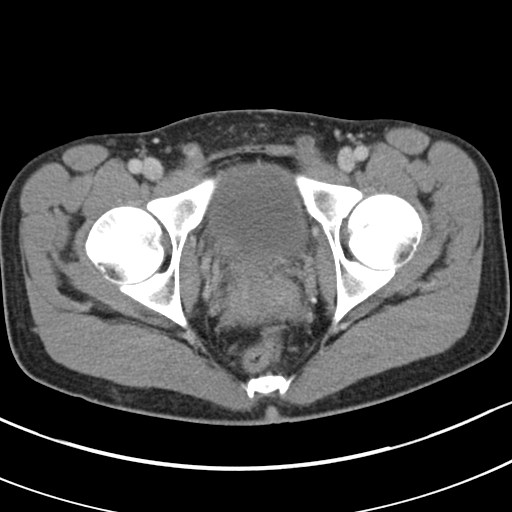
[im 23/85  soft-tissue]
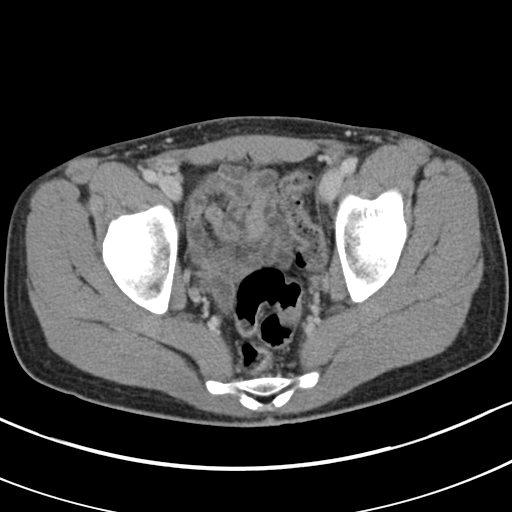
[im 31/85  soft-tissue]
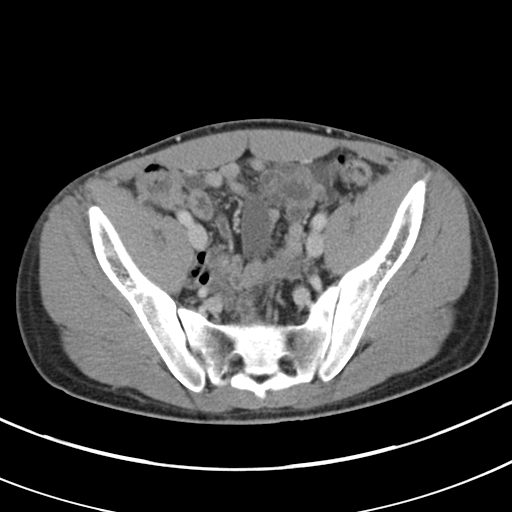
[im 36/85  soft-tissue]
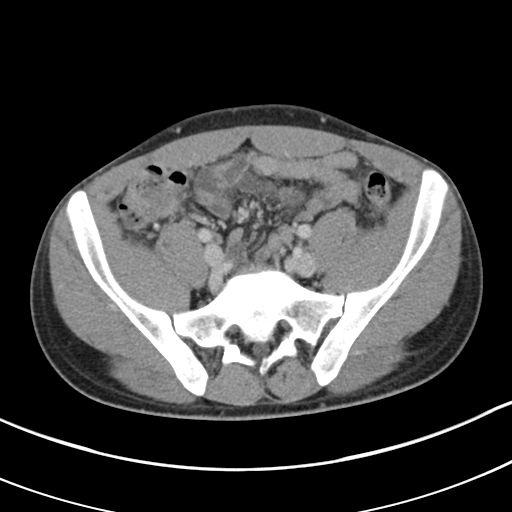
[im 45/85  soft-tissue]
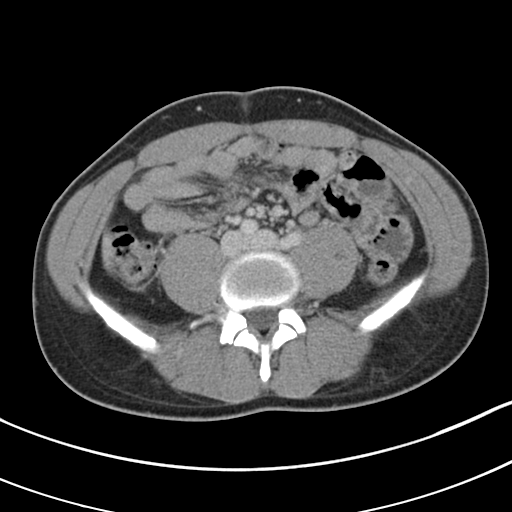
[im 49/85  soft-tissue]
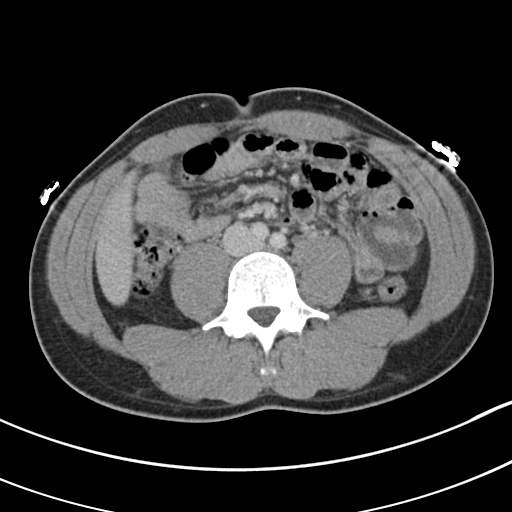
[im 54/85  soft-tissue]
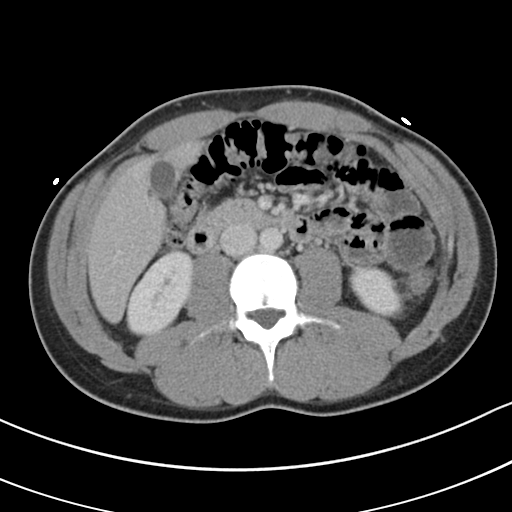
[im 54/85  bone]
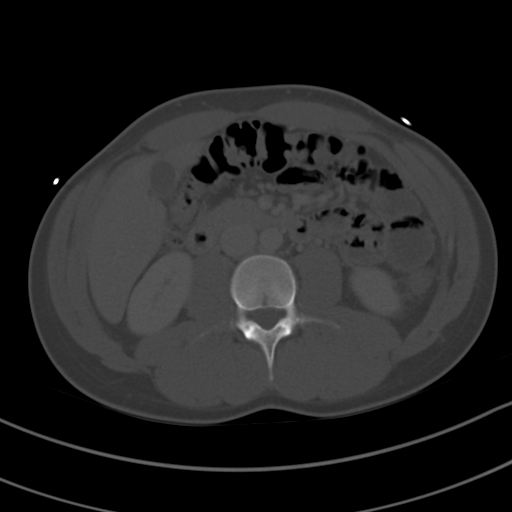
[im 62/85  soft-tissue]
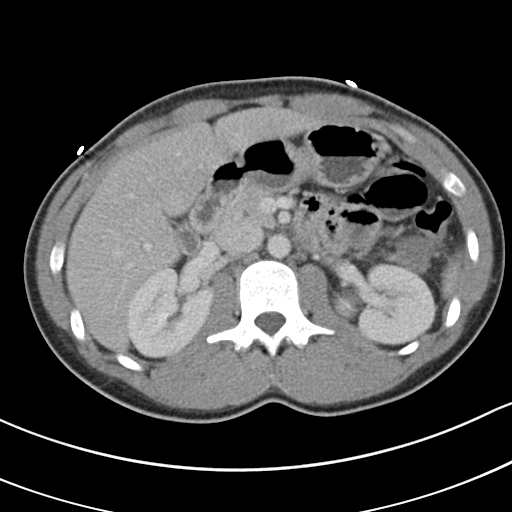
[im 67/85  soft-tissue]
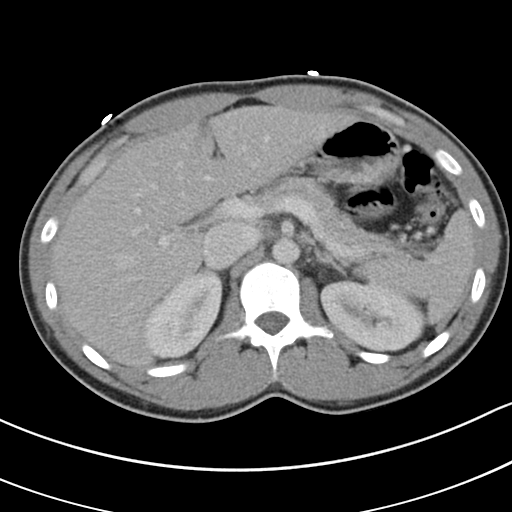
[im 71/85  soft-tissue]
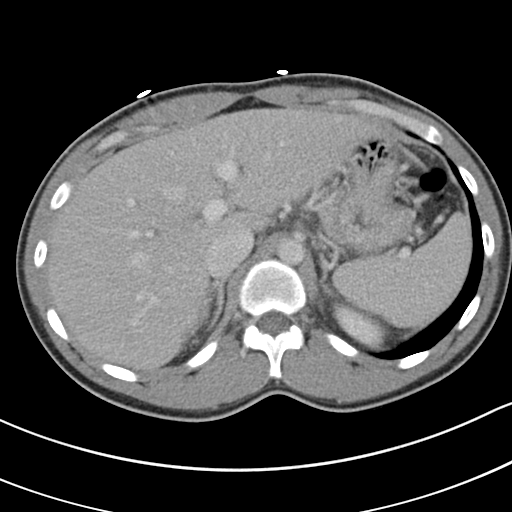
[im 80/85  soft-tissue]
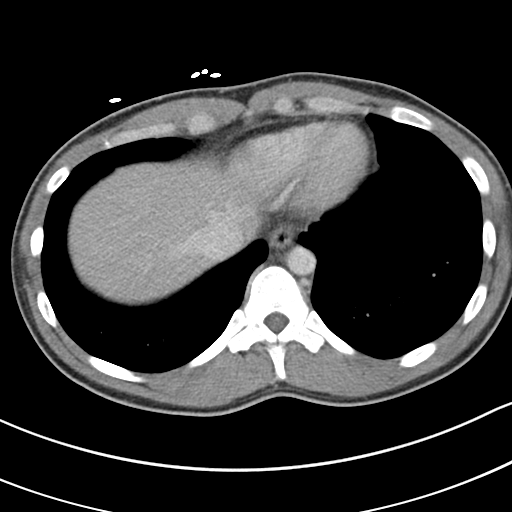

[Series 4: coronal a/|p · coronal · 0.80mm/px · 3 of 115 slices shown]
[im 39/115  soft-tissue]
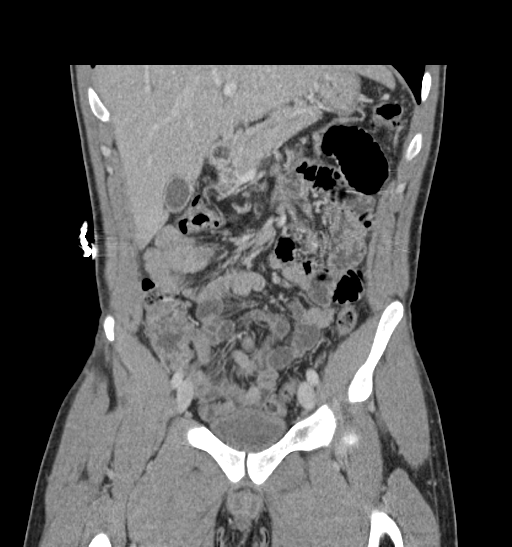
[im 51/115  soft-tissue]
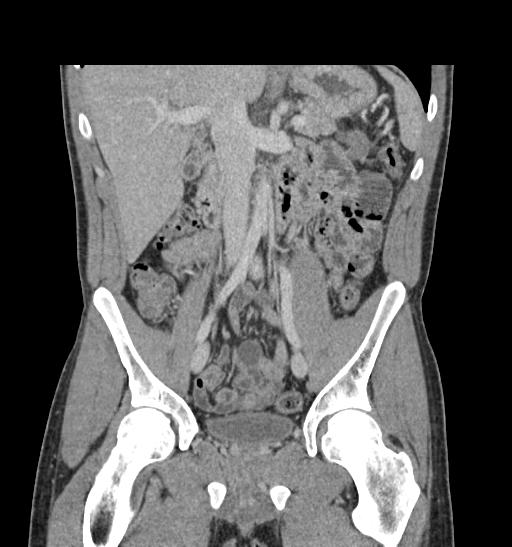
[im 64/115  soft-tissue]
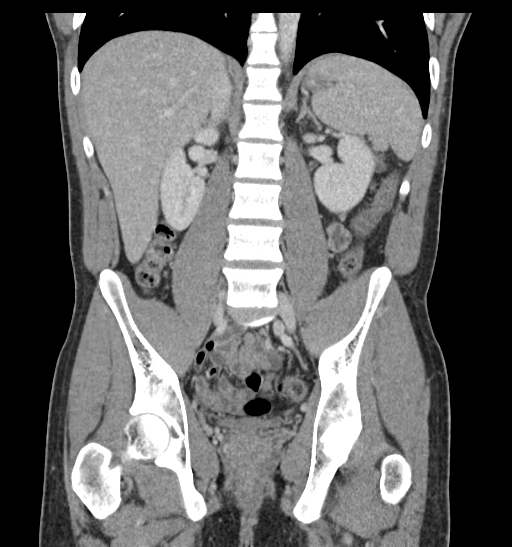

[16 of 46 positions shown; findings below may reference images not displayed]

FINDINGS: Lower chest: No acute abnormality.

Hepatobiliary: No focal liver abnormality is seen. No gallstones,
gallbladder wall thickening, or biliary dilatation.

Pancreas: Low-attenuation well-circumscribed lesion arising from the
tail of the pancreas extending into the left pre renal space
measuring 16 x 36 x 18 mm (AP x ML x CC series 2, image 22 and
series 4, image 55). No pancreatic ductal dilatation. No appreciable
peripancreatic inflammation.

Spleen: Normal in size without focal abnormality.

Adrenals/Urinary Tract: Adrenal glands are unremarkable. Kidneys are
normal, without renal calculi, focal lesion, or hydronephrosis.
Bladder is unremarkable.

Stomach/Bowel: Mild wall thickening of the upper descending colon
and fat stranding within the left pericolic gutter may represent a
mild acute colitis. Otherwise there is no obstructive or
inflammatory changes of the large and small bowel. The stomach is
unremarkable. Normal appendix.

Vascular/Lymphatic: No significant vascular findings are present. No
enlarged abdominal or pelvic lymph nodes.

Reproductive: Prostate is unremarkable.

Other: No abdominal wall hernia or abnormality. No abdominopelvic
ascites.

Musculoskeletal: No acute or significant osseous findings.
IMPRESSION: 1. Low attenuating well-circumscribed lesion arising from the tail
of pancreas extending into left pre renal space measuring up to 36
mm. Differential includes cystic pancreatic neoplasm and pseudocyst
given history of acute pancreatitis. Further evaluation with
MRI/MRCP with and without contrast is recommended on a nonemergent
basis.
2. Mild wall thickening of the upper descending colon and edema
within left pericolic gutter. Findings may represent mild infectious
colitis or possibly reactive inflammation related to the adjacent
pancreatic tail lesion.

By: Nikotimo Subbiah M.D.

## 2018-03-12 ENCOUNTER — Emergency Department (HOSPITAL_COMMUNITY)
Admission: EM | Admit: 2018-03-12 | Discharge: 2018-03-13 | Disposition: A | Discharge status: Home / Self Care | Payer: BLUE CROSS/BLUE SHIELD | Attending: Emergency Medicine | Admitting: Emergency Medicine

## 2018-03-12 ENCOUNTER — Other Ambulatory Visit: Payer: Self-pay

## 2018-03-12 ENCOUNTER — Encounter (HOSPITAL_COMMUNITY): Payer: Self-pay

## 2018-03-12 DIAGNOSIS — F17210 Nicotine dependence, cigarettes, uncomplicated: Secondary | ICD-10-CM | POA: Insufficient documentation

## 2018-03-12 DIAGNOSIS — Z79899 Other long term (current) drug therapy: Secondary | ICD-10-CM | POA: Insufficient documentation

## 2018-03-12 DIAGNOSIS — K852 Alcohol induced acute pancreatitis without necrosis or infection: Secondary | ICD-10-CM

## 2018-03-12 LAB — COMPREHENSIVE METABOLIC PANEL
ALT: 39 U/L (ref 17–63)
AST: 83 U/L — AB (ref 15–41)
Albumin: 4.6 g/dL (ref 3.5–5.0)
Alkaline Phosphatase: 63 U/L (ref 38–126)
Anion gap: 17 — ABNORMAL HIGH (ref 5–15)
BUN: 10 mg/dL (ref 6–20)
CALCIUM: 9.2 mg/dL (ref 8.9–10.3)
CO2: 20 mmol/L — AB (ref 22–32)
Chloride: 104 mmol/L (ref 101–111)
Creatinine, Ser: 1.06 mg/dL (ref 0.61–1.24)
GFR calc Af Amer: >60 mL/min (ref >60)
GLUCOSE: 89 mg/dL (ref 65–99)
Potassium: 4.2 mmol/L (ref 3.5–5.1)
Sodium: 141 mmol/L (ref 135–145)
TOTAL PROTEIN: 7.7 g/dL (ref 6.5–8.1)
Total Bilirubin: 0.8 mg/dL (ref 0.3–1.2)

## 2018-03-12 LAB — URINALYSIS, ROUTINE W REFLEX MICROSCOPIC
Bilirubin Urine: NEGATIVE
Glucose, UA: NEGATIVE mg/dL
Hgb urine dipstick: NEGATIVE
Ketones, ur: 20 mg/dL — AB
Leukocytes, UA: NEGATIVE
NITRITE: NEGATIVE
PH: 6 (ref 5.0–8.0)
Protein, ur: NEGATIVE mg/dL
Specific Gravity, Urine: 1.021 (ref 1.005–1.030)

## 2018-03-12 LAB — CBC
HEMATOCRIT: 42.6 % (ref 39.0–52.0)
Hemoglobin: 14.8 g/dL (ref 13.0–17.0)
MCH: 33.6 pg (ref 26.0–34.0)
MCHC: 34.7 g/dL (ref 30.0–36.0)
MCV: 96.8 fL (ref 78.0–100.0)
Platelets: 349 10*3/uL (ref 150–400)
RBC: 4.4 MIL/uL (ref 4.22–5.81)
RDW: 14.1 % (ref 11.5–15.5)
WBC: 6.5 10*3/uL (ref 4.0–10.5)

## 2018-03-12 LAB — LIPASE, BLOOD: LIPASE: 22 U/L (ref 11–51)

## 2018-03-12 MED ORDER — SODIUM CHLORIDE 0.9 % IV BOLUS (SEPSIS)
2000.0000 mL | Freq: Once | INTRAVENOUS | Status: AC
  Administered 2018-03-12: 2000 mL via INTRAVENOUS

## 2018-03-12 MED ORDER — METOCLOPRAMIDE HCL 5 MG/ML IJ SOLN
10.0000 mg | INTRAMUSCULAR | Status: AC
  Administered 2018-03-12: 10 mg via INTRAVENOUS
  Filled 2018-03-12: qty 2

## 2018-03-12 MED ORDER — FENTANYL CITRATE (PF) 100 MCG/2ML IJ SOLN
50.0000 ug | Freq: Once | INTRAMUSCULAR | Status: AC
  Administered 2018-03-12: 50 ug via INTRAVENOUS
  Filled 2018-03-12: qty 2

## 2018-03-12 MED ORDER — SODIUM CHLORIDE 0.9 % IV BOLUS (SEPSIS): 1000.0000 mL | Freq: Once | INTRAVENOUS | Status: DC

## 2018-03-12 MED ORDER — GI COCKTAIL ~~LOC~~
30.0000 mL | Freq: Once | ORAL | Status: AC
  Administered 2018-03-12: 30 mL via ORAL
  Filled 2018-03-12: qty 30

## 2018-03-12 MED ORDER — KETOROLAC TROMETHAMINE 30 MG/ML IJ SOLN
30.0000 mg | Freq: Once | INTRAMUSCULAR | Status: AC
  Administered 2018-03-12: 30 mg via INTRAVENOUS
  Filled 2018-03-12: qty 1

## 2018-03-12 MED ORDER — FAMOTIDINE IN NACL 20-0.9 MG/50ML-% IV SOLN
20.0000 mg | INTRAVENOUS | Status: AC
  Administered 2018-03-12: 20 mg via INTRAVENOUS
  Filled 2018-03-12: qty 50

## 2018-03-12 NOTE — ED Provider Notes (Signed)
[facility] EMERGENCY DEPARTMENT Provider Note   CSN: [id number] Arrival date & time: 03/12/18  2056     History   Chief Complaint Chief Complaint  Patient presents with  . Pancreatitis    HPI Steven Mckenzie is a 22 y.o. male.  22 year old male with a history of pancreatitis presents to the emergency department for evaluation of abdominal pain.  He complains of aching epigastric and left upper quadrant abdominal pain consistent with prior episodes of pancreatitis.  He does report alcohol use today, last drinking a few hours ago.  He has not taken any medications for his symptoms and reports improvement in his pain since receiving medication in triage.  He has had nausea and vomiting prior to arrival.  No history of bloody emesis or fever.  No bowel changes, urinary symptoms.  He denies a history of abdominal surgeries.  Last admission for ETOH induced pancreatitis was in October 2018.      Past Medical History:  Diagnosis Date  . Pancreatitis     Patient Active Problem List   Diagnosis Date Noted  . Normocytic anemia 10/01/2017  . Hypomagnesemia 10/01/2017  . 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 Alcohol withdrawal delirium, acute, hyperactive (HCC) 09/30/2017  . Alcohol abuse 09/30/2017  . Alcohol withdrawal (HCC) 09/30/2017  . Hypokalemia 05/17/2017    History reviewed. No pertinent surgical history.     Home Medications    Prior to Admission medications   Medication Sig Start Date End Date Taking? Authorizing Provider  cetirizine (ZYRTEC) 10 MG tablet Take 10 mg by mouth daily as needed (for seasonal allergies).   Yes [provider]  chlordiazePOXIDE (LIBRIUM) 25 MG capsule Take 25 mg 4 Times Daily for 1 day (First dose given in the hospital), the the Next Day take 25 mg 3 Times Daily for 1 Day, Then 25 mg po BID, Then 25 mg Daily for 1 Day and then stop Day 5. Patient not taking: Reported on 03/13/2018 10/05/17   Marguerita Merles Latif, DO   folic acid (FOLVITE) 1 MG tablet Take 1 tablet (1 mg total) by mouth daily. Patient not taking: Reported on 03/13/2018 10/05/17   Marguerita Merles Latif, DO  lipase/protease/amylase (CREON) 12000 units CPEP capsule Take 1 capsule (12,000 Units total) by mouth 3 (three) times daily with meals. Patient not taking: Reported on 03/13/2018 10/05/17   Marguerita Merles Latif, DO  pantoprazole (PROTONIX) 40 MG tablet Take 1 tablet (40 mg total) by mouth daily at 12 noon. 03/13/18   Antony Madura, PA-C  pregabalin (LYRICA) 25 MG capsule Take 1 capsule (25 mg total) by mouth daily. Patient not taking: Reported on 03/13/2018 10/05/17   Marguerita Merles Latif, DO  promethazine (PHENERGAN) 25 MG tablet Take 1 tablet (25 mg total) by mouth every 6 (six) hours as needed for nausea or vomiting. 03/13/18   Antony Madura, PA-C  propranolol (INDERAL) 10 MG tablet Take 0.5 tablets (5 mg total) by mouth 2 (two) times daily. Patient not taking: Reported on 03/13/2018 10/05/17   Marguerita Merles Latif, DO  thiamine 100 MG tablet Take 1 tablet (100 mg total) by mouth daily. Patient not taking: Reported on 03/13/2018 10/05/17   Merlene Laughter, DO    Family History Family History  Problem Relation Age of Onset  . Alcohol abuse Mother   . Diabetes Mother   . Alcohol abuse Father   . Hypertension Father   . CAD Other   . Diabetes Other   . Hypertension Other   .  Stroke Other     Social History Social History   Tobacco Use  . Smoking status: Current Every Day Smoker    Packs/day: 0.15    Types: Cigarettes  . Smokeless tobacco: Never Used  Substance Use Topics  . Alcohol use: Yes    Comment: daily   . Drug use: Yes    Types: Marijuana     Allergies   Banana; Celery oil; Percocet [oxycodone-acetaminophen]; and Pineapple   Review of Systems Review of Systems Ten systems reviewed and are negative for acute change, except as noted in the HPI.    Physical Exam Updated Vital Signs BP (!) 103/48   Pulse 90    Temp 98.2 F (36.8 C) (Oral)   Resp 19   Ht 5\' 7"  (1.702 m)   Wt 64.4 kg (142 lb)   SpO2 95%   BMI 22.24 kg/m   Physical Exam  Constitutional: He is oriented to person, place, and time. He appears well-developed and well-nourished. No distress.  Nontoxic appearing and in NAD  HENT:  Head: Normocephalic and atraumatic.  Eyes: Conjunctivae and EOM are normal. No scleral icterus.  Neck: Normal range of motion.  Cardiovascular: Regular rhythm and intact distal pulses.  Mild tachycardia  Pulmonary/Chest: Effort normal. No stridor. No respiratory distress. He has no wheezes.  Respirations even and unlabored  Abdominal: Soft. He exhibits no mass. There is tenderness. There is guarding (voluntary).  Soft abdomen without distention.  There is tenderness in the epigastrium and left upper quadrant with voluntary guarding.  No peritoneal signs or palpable masses.  Musculoskeletal: Normal range of motion.  Neurological: He is alert and oriented to person, place, and time. He exhibits normal muscle tone. Coordination normal.  GCS 15.  Patient moving all extremities.  Skin: Skin is warm and dry. No rash noted. He is not diaphoretic. No erythema. No pallor.  Psychiatric: He has a normal mood and affect. His behavior is normal.  Nursing note and vitals reviewed.    ED Treatments / Results  Labs (all labs ordered are listed, but only abnormal results are displayed) Labs Reviewed  COMPREHENSIVE METABOLIC PANEL - Abnormal; Notable for the following components:      Result Value   CO2 20 (*)    AST 83 (*)    Anion gap 17 (*)    All other components within normal limits  URINALYSIS, ROUTINE W REFLEX MICROSCOPIC - Abnormal; Notable for the following components:   Ketones, ur 20 (*)    All other components within normal limits  LIPASE, BLOOD  CBC    EKG  EKG Interpretation None       Radiology No results found.  Procedures Procedures (including critical care time)  Medications  Ordered in ED Medications  sodium chloride 0.9 % bolus 2,000 mL (0 mLs Intravenous Stopped 03/13/18 0022)  famotidine (PEPCID) IVPB 20 mg premix (0 mg Intravenous Stopped 03/12/18 2239)  metoCLOPramide (REGLAN) injection 10 mg (10 mg Intravenous Given 03/12/18 2206)  fentaNYL (SUBLIMAZE) injection 50 mcg (50 mcg Intravenous Given 03/12/18 2204)  ketorolac (TORADOL) 30 MG/ML injection 30 mg (30 mg Intravenous Given 03/12/18 2316)  gi cocktail (Maalox,Lidocaine,Donnatal) (30 mLs Oral Given 03/12/18 2315)  lactated ringers bolus 1,000 mL (0 mLs Intravenous Stopped 03/13/18 0211)  dicyclomine (BENTYL) injection 20 mg (20 mg Intramuscular Given 03/13/18 0022)    2:22 AM Patient reassessed.  He states that pain has improved.  Tachycardia has resolved.  He expresses comfort with further management of his symptoms on  an outpatient basis.   Initial Impression / Assessment and Plan / ED Course  I have reviewed the triage vital signs and the nursing notes.  Pertinent labs & imaging results that were available during my care of the patient were reviewed by me and considered in my medical decision making (see chart for details).     22 year old male presents to the emergency department for epigastric and left upper quadrant pain in the setting of recent alcohol use.  History of alcohol induced acute pancreatitis.  Prior admission in October with imaging showing likely pseudocyst of the pancreatic tail.  Lipase has not seem to be elevated on prior evaluations.  Laboratory workup today is reassuring with focal abdominal tenderness in the epigastrium and left upper quadrant with voluntary guarding on exam.  Symptoms have been managed supportively and patient has been adequately hydrated while in the emergency department.  This has resolved his nausea as well as his tachycardia.  Vitals have remained stable.  Patient tolerating ice chips.  Ranson score is less than or equal to 1 and patient reporting symptomatic  improvement on repeat assessment; severe pancreatitis unlikely.  I believe it is appropriate for him to continue to be managed on an outpatient basis at this time.  Will prescribe Phenergan for nausea and Protonix for daily use.  The patient has been advised to discontinue use of alcohol into hydrate with clear liquids throughout the day.  Referral given to gastroenterology.  Return precautions discussed and provided. Patient discharged in stable condition with no unaddressed concerns.   Final Clinical Impressions(s) / ED Diagnoses   Final diagnoses:  Alcohol-induced acute pancreatitis, unspecified complication status    ED Discharge Orders        Ordered    pantoprazole (PROTONIX) 40 MG tablet  Daily     03/13/18 0225    promethazine (PHENERGAN) 25 MG tablet  Every 6 hours PRN     03/13/18 0225       Antony Madura, PA-C 03/13/18 0231    Mesner, Barbara Cower, MD 03/13/18 2225

## 2018-03-12 NOTE — ED Triage Notes (Signed)
Pt endorses severe abd pain, has hx of pancreatitis and has been drinking alcohol per the significant other. Tachy at 148 in triage. Pt tearful and bent over in pain. BP normal.

## 2018-03-13 ENCOUNTER — Other Ambulatory Visit: Payer: Self-pay

## 2018-03-13 ENCOUNTER — Encounter (HOSPITAL_COMMUNITY): Payer: Self-pay | Admitting: *Deleted

## 2018-03-13 ENCOUNTER — Emergency Department (HOSPITAL_COMMUNITY)
Admission: EM | Admit: 2018-03-13 | Discharge: 2018-03-14 | Disposition: A | Discharge status: Other Acute Inpatient Hospital | Payer: BLUE CROSS/BLUE SHIELD | Attending: Emergency Medicine | Admitting: Emergency Medicine

## 2018-03-13 DIAGNOSIS — F1020 Alcohol dependence, uncomplicated: Secondary | ICD-10-CM

## 2018-03-13 DIAGNOSIS — F329 Major depressive disorder, single episode, unspecified: Secondary | ICD-10-CM | POA: Diagnosis not present

## 2018-03-13 DIAGNOSIS — R109 Unspecified abdominal pain: Secondary | ICD-10-CM | POA: Insufficient documentation

## 2018-03-13 DIAGNOSIS — F17210 Nicotine dependence, cigarettes, uncomplicated: Secondary | ICD-10-CM | POA: Insufficient documentation

## 2018-03-13 DIAGNOSIS — F10930 Alcohol use, unspecified with withdrawal, uncomplicated: Secondary | ICD-10-CM

## 2018-03-13 DIAGNOSIS — F10230 Alcohol dependence with withdrawal, uncomplicated: Secondary | ICD-10-CM | POA: Diagnosis not present

## 2018-03-13 DIAGNOSIS — Z046 Encounter for general psychiatric examination, requested by authority: Secondary | ICD-10-CM | POA: Diagnosis not present

## 2018-03-13 DIAGNOSIS — R251 Tremor, unspecified: Secondary | ICD-10-CM | POA: Diagnosis present

## 2018-03-13 HISTORY — DX: Alcohol abuse, uncomplicated: F10.10

## 2018-03-13 LAB — COMPREHENSIVE METABOLIC PANEL
ALT: 39 U/L (ref 17–63)
AST: 87 U/L — ABNORMAL HIGH (ref 15–41)
Albumin: 4.2 g/dL (ref 3.5–5.0)
Alkaline Phosphatase: 56 U/L (ref 38–126)
Anion gap: 13 (ref 5–15)
BILIRUBIN TOTAL: 1.9 mg/dL — AB (ref 0.3–1.2)
BUN: <5 mg/dL — ABNORMAL LOW (ref 6–20)
CHLORIDE: 103 mmol/L (ref 101–111)
CO2: 23 mmol/L (ref 22–32)
CREATININE: 0.86 mg/dL (ref 0.61–1.24)
Calcium: 9.3 mg/dL (ref 8.9–10.3)
GFR calc Af Amer: >60 mL/min (ref >60)
Glucose, Bld: 88 mg/dL (ref 65–99)
Potassium: 4.1 mmol/L (ref 3.5–5.1)
Sodium: 139 mmol/L (ref 135–145)
TOTAL PROTEIN: 7 g/dL (ref 6.5–8.1)

## 2018-03-13 LAB — CBC
HEMATOCRIT: 38.9 % — AB (ref 39.0–52.0)
Hemoglobin: 13.2 g/dL (ref 13.0–17.0)
MCH: 32.8 pg (ref 26.0–34.0)
MCHC: 33.9 g/dL (ref 30.0–36.0)
MCV: 96.5 fL (ref 78.0–100.0)
PLATELETS: 273 10*3/uL (ref 150–400)
RBC: 4.03 MIL/uL — ABNORMAL LOW (ref 4.22–5.81)
RDW: 14.1 % (ref 11.5–15.5)
WBC: 3.7 10*3/uL — AB (ref 4.0–10.5)

## 2018-03-13 LAB — RAPID URINE DRUG SCREEN, HOSP PERFORMED
Amphetamines: NOT DETECTED
BARBITURATES: NOT DETECTED
BENZODIAZEPINES: NOT DETECTED
COCAINE: NOT DETECTED
OPIATES: NOT DETECTED
Tetrahydrocannabinol: NOT DETECTED

## 2018-03-13 LAB — ETHANOL

## 2018-03-13 MED ORDER — LACTATED RINGERS IV BOLUS (SEPSIS)
1000.0000 mL | Freq: Once | INTRAVENOUS | Status: AC
  Administered 2018-03-13: 1000 mL via INTRAVENOUS

## 2018-03-13 MED ORDER — PROMETHAZINE HCL 25 MG PO TABS: 25.0000 mg | ORAL_TABLET | Freq: Four times a day (QID) | ORAL | 0 refills | Status: DC | PRN

## 2018-03-13 MED ORDER — THIAMINE HCL 100 MG/ML IJ SOLN
100.0000 mg | Freq: Every day | INTRAMUSCULAR | Status: DC
  Administered 2018-03-13: 100 mg via INTRAVENOUS
  Filled 2018-03-13: qty 2

## 2018-03-13 MED ORDER — DICYCLOMINE HCL 10 MG/ML IM SOLN
20.0000 mg | Freq: Once | INTRAMUSCULAR | Status: AC
  Administered 2018-03-13: 20 mg via INTRAMUSCULAR
  Filled 2018-03-13: qty 2

## 2018-03-13 MED ORDER — LORAZEPAM 2 MG/ML IJ SOLN: 0.0000 mg | Freq: Two times a day (BID) | INTRAMUSCULAR | Status: DC

## 2018-03-13 MED ORDER — SODIUM CHLORIDE 0.9 % IV BOLUS (SEPSIS)
500.0000 mL | Freq: Once | INTRAVENOUS | Status: AC
  Administered 2018-03-13: 500 mL via INTRAVENOUS

## 2018-03-13 MED ORDER — IBUPROFEN 400 MG PO TABS
600.0000 mg | ORAL_TABLET | Freq: Three times a day (TID) | ORAL | Status: DC | PRN
  Administered 2018-03-13: 600 mg via ORAL
  Filled 2018-03-13 (×2): qty 1

## 2018-03-13 MED ORDER — LORAZEPAM 1 MG PO TABS: 0.0000 mg | ORAL_TABLET | Freq: Two times a day (BID) | ORAL | Status: DC

## 2018-03-13 MED ORDER — SODIUM CHLORIDE 0.9 % IV SOLN
INTRAVENOUS | Status: DC
  Administered 2018-03-13: 13:00:00 via INTRAVENOUS

## 2018-03-13 MED ORDER — ONDANSETRON HCL 4 MG/2ML IJ SOLN
4.0000 mg | Freq: Once | INTRAMUSCULAR | Status: AC
  Administered 2018-03-13: 4 mg via INTRAVENOUS
  Filled 2018-03-13: qty 2

## 2018-03-13 MED ORDER — HYDROCODONE-ACETAMINOPHEN 5-325 MG PO TABS
1.0000 | ORAL_TABLET | ORAL | Status: DC | PRN
  Administered 2018-03-13 – 2018-03-14 (×4): 1 via ORAL
  Filled 2018-03-13 (×6): qty 1

## 2018-03-13 MED ORDER — LORAZEPAM 2 MG/ML IJ SOLN
0.0000 mg | Freq: Four times a day (QID) | INTRAMUSCULAR | Status: DC
  Administered 2018-03-13: 1 mg via INTRAVENOUS
  Administered 2018-03-13: 2 mg via INTRAVENOUS
  Filled 2018-03-13 (×2): qty 1

## 2018-03-13 MED ORDER — ONDANSETRON HCL 4 MG PO TABS
4.0000 mg | ORAL_TABLET | Freq: Three times a day (TID) | ORAL | Status: DC | PRN
  Filled 2018-03-13: qty 1

## 2018-03-13 MED ORDER — LORAZEPAM 1 MG PO TABS
0.0000 mg | ORAL_TABLET | Freq: Four times a day (QID) | ORAL | Status: DC
  Administered 2018-03-13 – 2018-03-14 (×3): 1 mg via ORAL
  Filled 2018-03-13: qty 1
  Filled 2018-03-13: qty 2
  Filled 2018-03-13: qty 1

## 2018-03-13 MED ORDER — VITAMIN B-1 100 MG PO TABS
100.0000 mg | ORAL_TABLET | Freq: Every day | ORAL | Status: DC
  Administered 2018-03-14: 100 mg via ORAL
  Filled 2018-03-13 (×2): qty 1

## 2018-03-13 MED ORDER — PANTOPRAZOLE SODIUM 40 MG PO TBEC: 40.0000 mg | DELAYED_RELEASE_TABLET | Freq: Every day | ORAL | 0 refills | Status: DC

## 2018-03-13 NOTE — ED Notes (Signed)
TTS at bedside. Pt resting comfortably in bed, though is requesting pain medication. MD aware, no new orders at this time.

## 2018-03-13 NOTE — ED Triage Notes (Addendum)
Pt states he was tx here last night for pancreatitis.  Usually drinks 1 bottle of vodka per day and last drank at 2000 last night.  PT shaking, nauseated, mild headache.  CIWA 18 in triage.

## 2018-03-13 NOTE — ED Notes (Signed)
MD at bedside. 

## 2018-03-13 NOTE — ED Notes (Signed)
Got patient on the monitor patient is resting with family at bedside and call bell in reach  °

## 2018-03-13 NOTE — Discharge Instructions (Signed)
STOP DRINKING ALCOHOL  We recommend he follow-up with a gastroenterologist regarding your visit today.  Be sure to drink plenty of clear liquids such as water or Gatorade to prevent dehydration and to continue to help heal your pancreas.  Take Protonix as prescribed for discomfort.  You may supplement this with Phenergan as needed for nausea or vomiting.  You may return to the emergency department, as needed, for new or concerning symptoms.
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
## 2018-03-13 NOTE — ED Provider Notes (Signed)
[facility] EMERGENCY DEPARTMENT Provider Note   CSN: [id number] Arrival date & time: 03/13/18  1139     History   Chief Complaint Chief Complaint  Patient presents with  . Alcohol Intoxication    HPI Steven Mckenzie is a 22 y.o. male.  He presents for evaluation of shakiness from alcohol withdrawal.  States he had his last alcohol about 12 hours ago.  He typically drinks "a bottle or more of liquor," every day.  He was evaluated last night, presenting at 2100 for similar problems.  He states that he relapsed with alcohol use about 3 weeks ago, after a 3-week period of sobriety, following a hospitalization "for detox."  He feels like he needs additional help to stop drinking, and requests that he be admitted.  He reported to nursing that he was having abdominal pain but did not spontaneously complain of abdominal pain when I evaluated him.  He has history of similar problems.  He has previously been diagnosed with a pancreatic pseudocyst.  There are no other known modifying factors.  HPI  Past Medical History:  Diagnosis Date  . Alcohol abuse   . Pancreatitis     Patient Active Problem List   Diagnosis Date Noted  . Normocytic anemia 10/01/2017  . Hypomagnesemia 10/01/2017  . 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 Alcohol withdrawal delirium, acute, hyperactive (HCC) 09/30/2017  . Alcohol abuse 09/30/2017  . Alcohol withdrawal (HCC) 09/30/2017  . Hypokalemia 05/17/2017    History reviewed. No pertinent surgical history.     Home Medications    Prior to Admission medications   Medication Sig Start Date End Date Taking? Authorizing Provider  cetirizine (ZYRTEC) 10 MG tablet Take 10 mg by mouth daily as needed (for seasonal allergies).    [provider]  chlordiazePOXIDE (LIBRIUM) 25 MG capsule Take 25 mg 4 Times Daily for 1 day (First dose given in the hospital), the the Next Day take 25 mg 3 Times Daily for 1 Day, Then 25 mg po BID, Then 25 mg  Daily for 1 Day and then stop Day 5. Patient not taking: Reported on 03/13/2018 10/05/17   Marguerita Merles Latif, DO  folic acid (FOLVITE) 1 MG tablet Take 1 tablet (1 mg total) by mouth daily. Patient not taking: Reported on 03/13/2018 10/05/17   Marguerita Merles Latif, DO  lipase/protease/amylase (CREON) 12000 units CPEP capsule Take 1 capsule (12,000 Units total) by mouth 3 (three) times daily with meals. Patient not taking: Reported on 03/13/2018 10/05/17   Marguerita Merles Latif, DO  pantoprazole (PROTONIX) 40 MG tablet Take 1 tablet (40 mg total) by mouth daily at 12 noon. 03/13/18   Antony Madura, PA-C  pregabalin (LYRICA) 25 MG capsule Take 1 capsule (25 mg total) by mouth daily. Patient not taking: Reported on 03/13/2018 10/05/17   Marguerita Merles Latif, DO  promethazine (PHENERGAN) 25 MG tablet Take 1 tablet (25 mg total) by mouth every 6 (six) hours as needed for nausea or vomiting. 03/13/18   Antony Madura, PA-C  propranolol (INDERAL) 10 MG tablet Take 0.5 tablets (5 mg total) by mouth 2 (two) times daily. Patient not taking: Reported on 03/13/2018 10/05/17   Marguerita Merles Latif, DO  thiamine 100 MG tablet Take 1 tablet (100 mg total) by mouth daily. Patient not taking: Reported on 03/13/2018 10/05/17   Merlene Laughter, DO    Family History Family History  Problem Relation Age of Onset  . Alcohol abuse Mother   . Diabetes Mother   .  Alcohol abuse Father   . Hypertension Father   . CAD Other   . Diabetes Other   . Hypertension Other   . Stroke Other     Social History Social History   Tobacco Use  . Smoking status: Current Some Day Smoker    Packs/day: 0.15    Types: Cigarettes  . Smokeless tobacco: Never Used  Substance Use Topics  . Alcohol use: Yes    Comment: 1 bottle vodka per day  . Drug use: Yes    Types: Marijuana     Allergies   Banana; Celery oil; Percocet [oxycodone-acetaminophen]; and Pineapple   Review of Systems Review of Systems  All other systems reviewed  and are negative.    Physical Exam Updated Vital Signs BP 135/86   Pulse 86   Temp 98 F (36.7 C) (Oral)   Resp 16   Ht 5\' 7"  (1.702 m)   Wt 64.4 kg (142 lb)   SpO2 98%   BMI 22.24 kg/m   Physical Exam  Constitutional: He is oriented to person, place, and time. He appears well-developed and well-nourished. No distress.  HENT:  Head: Normocephalic and atraumatic.  Right Ear: External ear normal.  Left Ear: External ear normal.  Eyes: Conjunctivae and EOM are normal. Pupils are equal, round, and reactive to light.  Neck: Normal range of motion and phonation normal. Neck supple.  Cardiovascular: Normal rate, regular rhythm and normal heart sounds.  Pulmonary/Chest: Effort normal and breath sounds normal. He exhibits no bony tenderness.  Abdominal: Soft. There is no tenderness.  Musculoskeletal: Normal range of motion.  Normal strength arms and legs bilaterally.  Neurological: He is alert and oriented to person, place, and time. No cranial nerve deficit or sensory deficit. He exhibits normal muscle tone. Coordination normal.  Mild tremulousness, which extinguishes, when he follows commands to move arms and legs.  No dysarthria or aphasia.  No asterixis.   Skin: Skin is warm, dry and intact.  Psychiatric: He has a normal mood and affect. His behavior is normal. Judgment and thought content normal.  Nursing note and vitals reviewed.    ED Treatments / Results  Labs (all labs ordered are listed, but only abnormal results are displayed) Labs Reviewed  COMPREHENSIVE METABOLIC PANEL - Abnormal; Notable for the following components:      Result Value   BUN <5 (*)    AST 87 (*)    Total Bilirubin 1.9 (*)    All other components within normal limits  CBC - Abnormal; Notable for the following components:   WBC 3.7 (*)    RBC 4.03 (*)    HCT 38.9 (*)    All other components within normal limits  ETHANOL  RAPID URINE DRUG SCREEN, HOSP PERFORMED    EKG  EKG  Interpretation None       Radiology No results found.  Procedures Procedures (including critical care time)  Medications Ordered in ED Medications  LORazepam (ATIVAN) injection 0-4 mg (not administered)    Or  LORazepam (ATIVAN) tablet 0-4 mg (not administered)  LORazepam (ATIVAN) injection 0-4 mg (not administered)    Or  LORazepam (ATIVAN) tablet 0-4 mg (not administered)  thiamine (VITAMIN B-1) tablet 100 mg (not administered)    Or  thiamine (B-1) injection 100 mg (not administered)  ibuprofen (ADVIL,MOTRIN) tablet 600 mg (not administered)  ondansetron (ZOFRAN) tablet 4 mg (not administered)  sodium chloride 0.9 % bolus 500 mL (not administered)  0.9 %  sodium chloride infusion (not  administered)     Initial Impression / Assessment and Plan / ED Course  I have reviewed the triage vital signs and the nursing notes.  Pertinent labs & imaging results that were available during my care of the patient were reviewed by me and considered in my medical decision making (see chart for details).  Clinical Course as of Mar 13 1826  Mon Mar 13, 2018  1256 The CIWA  score was elevated at 18, Ativan ordered. IV bolus ordered for suspected dehydration.  [EW]  1428 He is talking to TTS, on the monitor, now.  He states the shakiness is better but he is having abdominal pain.  He appears fairly comfortable at this time.  [EW]  1826 The evaluation for complications of alcoholism and alcohol withdrawal, indicates normal seen it, normal CBC, normal urine drug screen, normal alcohol level.  [EW]    Clinical Course User Index [EW] Mancel BaleWentz, Lyza Houseworth, MD     Patient Vitals for the past 24 hrs:  BP Temp Temp src Pulse Resp SpO2 Height Weight  03/13/18 1234 135/86 - - 86 - 98 % - -  03/13/18 1200 (!) 142/82 - - (!) 51 - - - -  03/13/18 1158 - - - - - - 5\' 7"  (1.702 m) 64.4 kg (142 lb)  03/13/18 1141 (!) 142/82 98 F (36.7 C) Oral (!) 51 16 100 % - -   18: 00-he has been seen by TTS who  plan on admitting him for psychiatric treatment.  6:25 PM Reevaluation with update and discussion. After initial assessment and treatment, an updated evaluation reveals he is fairly comfortable.  He is agreeable with plan. Mancel BaleElliott Shakai Dolley     Final Clinical Impressions(s) / ED Diagnoses   Final diagnoses:  Alcoholism (HCC)  Alcohol withdrawal syndrome without complication (HCC)    Recurrent alcoholism, with alcohol withdrawal symptoms.  Abdominal pain, nonspecific, with history of pancreatic pseudocyst.  Doubt acute pancreatitis.  Doubt metabolic instability or impending vascular collapse.  Nursing Notes Reviewed/ Care Coordinated Applicable Imaging Reviewed Interpretation of Laboratory Data incorporated into ED treatment   Plan-as per TTS in conjunction with oncoming provider team  ED Discharge Orders    None       Mancel BaleWentz, Yesennia Hirota, MD 03/13/18 [phone]

## 2018-03-14 ENCOUNTER — Emergency Department (HOSPITAL_COMMUNITY)
Admission: EM | Admit: 2018-03-14 | Discharge: 2018-03-14 | Disposition: A | Discharge status: Other Acute Inpatient Hospital | Payer: BLUE CROSS/BLUE SHIELD | Attending: Emergency Medicine | Admitting: Emergency Medicine

## 2018-03-14 ENCOUNTER — Other Ambulatory Visit: Payer: Self-pay

## 2018-03-14 ENCOUNTER — Encounter (HOSPITAL_COMMUNITY): Payer: Self-pay

## 2018-03-14 ENCOUNTER — Inpatient Hospital Stay (HOSPITAL_COMMUNITY): Payer: BLUE CROSS/BLUE SHIELD

## 2018-03-14 ENCOUNTER — Inpatient Hospital Stay (HOSPITAL_COMMUNITY)
Admission: AD | Admit: 2018-03-14 | Discharge: 2018-03-16 | DRG: 881 | Disposition: A | Discharge status: Home / Self Care | Payer: BLUE CROSS/BLUE SHIELD | Source: Intra-hospital | Attending: Emergency Medicine | Admitting: Emergency Medicine

## 2018-03-14 DIAGNOSIS — Z91018 Allergy to other foods: Secondary | ICD-10-CM | POA: Diagnosis not present

## 2018-03-14 DIAGNOSIS — Z811 Family history of alcohol abuse and dependence: Secondary | ICD-10-CM | POA: Diagnosis not present

## 2018-03-14 DIAGNOSIS — F1290 Cannabis use, unspecified, uncomplicated: Secondary | ICD-10-CM | POA: Diagnosis present

## 2018-03-14 DIAGNOSIS — G4700 Insomnia, unspecified: Secondary | ICD-10-CM | POA: Diagnosis present

## 2018-03-14 DIAGNOSIS — R109 Unspecified abdominal pain: Secondary | ICD-10-CM | POA: Diagnosis not present

## 2018-03-14 DIAGNOSIS — R110 Nausea: Secondary | ICD-10-CM | POA: Insufficient documentation

## 2018-03-14 DIAGNOSIS — Z885 Allergy status to narcotic agent status: Secondary | ICD-10-CM | POA: Diagnosis not present

## 2018-03-14 DIAGNOSIS — F329 Major depressive disorder, single episode, unspecified: Secondary | ICD-10-CM | POA: Diagnosis present

## 2018-03-14 DIAGNOSIS — F17210 Nicotine dependence, cigarettes, uncomplicated: Secondary | ICD-10-CM | POA: Diagnosis present

## 2018-03-14 DIAGNOSIS — F1020 Alcohol dependence, uncomplicated: Secondary | ICD-10-CM | POA: Diagnosis not present

## 2018-03-14 DIAGNOSIS — F419 Anxiety disorder, unspecified: Secondary | ICD-10-CM | POA: Diagnosis present

## 2018-03-14 DIAGNOSIS — R1013 Epigastric pain: Secondary | ICD-10-CM

## 2018-03-14 DIAGNOSIS — K861 Other chronic pancreatitis: Secondary | ICD-10-CM | POA: Diagnosis present

## 2018-03-14 DIAGNOSIS — Z79899 Other long term (current) drug therapy: Secondary | ICD-10-CM

## 2018-03-14 DIAGNOSIS — F330 Major depressive disorder, recurrent, mild: Secondary | ICD-10-CM | POA: Diagnosis not present

## 2018-03-14 DIAGNOSIS — R251 Tremor, unspecified: Secondary | ICD-10-CM | POA: Diagnosis not present

## 2018-03-14 LAB — COMPREHENSIVE METABOLIC PANEL
ALBUMIN: 4.5 g/dL (ref 3.5–5.0)
ALT: 40 U/L (ref 17–63)
AST: 77 U/L — AB (ref 15–41)
Alkaline Phosphatase: 65 U/L (ref 38–126)
Anion gap: 11 (ref 5–15)
BILIRUBIN TOTAL: 1 mg/dL (ref 0.3–1.2)
BUN: 8 mg/dL (ref 6–20)
CO2: 26 mmol/L (ref 22–32)
Calcium: 9.7 mg/dL (ref 8.9–10.3)
Chloride: 99 mmol/L — ABNORMAL LOW (ref 101–111)
Creatinine, Ser: 0.85 mg/dL (ref 0.61–1.24)
GFR calc Af Amer: >60 mL/min (ref >60)
GFR calc non Af Amer: >60 mL/min (ref >60)
GLUCOSE: 98 mg/dL (ref 65–99)
Potassium: 4.2 mmol/L (ref 3.5–5.1)
SODIUM: 136 mmol/L (ref 135–145)
Total Protein: 7.9 g/dL (ref 6.5–8.1)

## 2018-03-14 LAB — URINALYSIS, ROUTINE W REFLEX MICROSCOPIC
BILIRUBIN URINE: NEGATIVE
GLUCOSE, UA: NEGATIVE mg/dL
HGB URINE DIPSTICK: NEGATIVE
Ketones, ur: 20 mg/dL — AB
Leukocytes, UA: NEGATIVE
Nitrite: NEGATIVE
PH: 7 (ref 5.0–8.0)
Protein, ur: NEGATIVE mg/dL
Specific Gravity, Urine: 1.008 (ref 1.005–1.030)

## 2018-03-14 LAB — CBC
HEMATOCRIT: 44.1 % (ref 39.0–52.0)
HEMOGLOBIN: 15.5 g/dL (ref 13.0–17.0)
MCH: 33.7 pg (ref 26.0–34.0)
MCHC: 35.1 g/dL (ref 30.0–36.0)
MCV: 95.9 fL (ref 78.0–100.0)
Platelets: 269 10*3/uL (ref 150–400)
RBC: 4.6 MIL/uL (ref 4.22–5.81)
RDW: 13.6 % (ref 11.5–15.5)
WBC: 4.5 10*3/uL (ref 4.0–10.5)

## 2018-03-14 LAB — LIPASE, BLOOD: Lipase: 24 U/L (ref 11–51)

## 2018-03-14 MED ORDER — KETOROLAC TROMETHAMINE 15 MG/ML IJ SOLN: 15.0000 mg | Freq: Once | INTRAMUSCULAR | Status: DC

## 2018-03-14 MED ORDER — TRAMADOL HCL 50 MG PO TABS
50.0000 mg | ORAL_TABLET | Freq: Two times a day (BID) | ORAL | Status: DC | PRN
  Administered 2018-03-14: 50 mg via ORAL
  Filled 2018-03-14: qty 1

## 2018-03-14 MED ORDER — LORAZEPAM 1 MG PO TABS
0.0000 mg | ORAL_TABLET | Freq: Four times a day (QID) | ORAL | Status: DC
  Administered 2018-03-14: 1 mg via ORAL
  Filled 2018-03-14: qty 1

## 2018-03-14 MED ORDER — IBUPROFEN 800 MG PO TABS
800.0000 mg | ORAL_TABLET | Freq: Once | ORAL | Status: AC
  Administered 2018-03-14: 800 mg via ORAL
  Filled 2018-03-14: qty 1

## 2018-03-14 MED ORDER — FAMOTIDINE 40 MG PO TABS
40.0000 mg | ORAL_TABLET | Freq: Every day | ORAL | Status: DC
  Administered 2018-03-15 – 2018-03-16 (×2): 40 mg via ORAL
  Filled 2018-03-14 (×4): qty 1

## 2018-03-14 MED ORDER — THIAMINE HCL 100 MG/ML IJ SOLN: 100.0000 mg | Freq: Every day | INTRAMUSCULAR | Status: DC

## 2018-03-14 MED ORDER — TRAMADOL HCL 50 MG PO TABS: 50.0000 mg | ORAL_TABLET | Freq: Two times a day (BID) | ORAL | Status: DC | PRN

## 2018-03-14 MED ORDER — ONDANSETRON HCL 4 MG/2ML IJ SOLN: 4.0000 mg | Freq: Once | INTRAMUSCULAR | Status: DC

## 2018-03-14 MED ORDER — VITAMIN B-1 100 MG PO TABS
100.0000 mg | ORAL_TABLET | Freq: Every day | ORAL | Status: DC
  Administered 2018-03-15 – 2018-03-16 (×2): 100 mg via ORAL
  Filled 2018-03-14 (×4): qty 1

## 2018-03-14 MED ORDER — MAGNESIUM HYDROXIDE 400 MG/5ML PO SUSP: 30.0000 mL | Freq: Every day | ORAL | Status: DC | PRN

## 2018-03-14 MED ORDER — FAMOTIDINE IN NACL 20-0.9 MG/50ML-% IV SOLN
20.0000 mg | Freq: Once | INTRAVENOUS | Status: DC
  Filled 2018-03-14: qty 50

## 2018-03-14 MED ORDER — ALUM & MAG HYDROXIDE-SIMETH 200-200-20 MG/5ML PO SUSP: 30.0000 mL | ORAL | Status: DC | PRN

## 2018-03-14 MED ORDER — IBUPROFEN 600 MG PO TABS: 600.0000 mg | ORAL_TABLET | Freq: Three times a day (TID) | ORAL | Status: DC | PRN

## 2018-03-14 MED ORDER — FENTANYL CITRATE (PF) 100 MCG/2ML IJ SOLN
25.0000 ug | Freq: Once | INTRAMUSCULAR | Status: AC
  Administered 2018-03-14: 25 ug via INTRAVENOUS
  Filled 2018-03-14: qty 2

## 2018-03-14 MED ORDER — ONDANSETRON HCL 4 MG PO TABS
4.0000 mg | ORAL_TABLET | Freq: Three times a day (TID) | ORAL | Status: DC | PRN
Start: 2018-03-14 — End: 2018-03-15

## 2018-03-14 MED ORDER — ACETAMINOPHEN 500 MG PO TABS: 1000.0000 mg | ORAL_TABLET | Freq: Three times a day (TID) | ORAL | Status: DC | PRN

## 2018-03-14 MED ORDER — HYDROXYZINE HCL 25 MG PO TABS: 25.0000 mg | ORAL_TABLET | Freq: Three times a day (TID) | ORAL | Status: DC | PRN

## 2018-03-14 MED ORDER — SODIUM CHLORIDE 0.9 % IV BOLUS (SEPSIS)
1000.0000 mL | Freq: Once | INTRAVENOUS | Status: AC
  Administered 2018-03-14: 1000 mL via INTRAVENOUS

## 2018-03-14 MED ORDER — LORAZEPAM 2 MG/ML IJ SOLN: 0.0000 mg | Freq: Four times a day (QID) | INTRAMUSCULAR | Status: DC

## 2018-03-14 MED ORDER — TRAZODONE HCL 50 MG PO TABS
50.0000 mg | ORAL_TABLET | Freq: Every evening | ORAL | Status: DC | PRN
  Administered 2018-03-14: 50 mg via ORAL
  Filled 2018-03-14: qty 1

## 2018-03-14 NOTE — Progress Notes (Signed)
[facility] report given for transfer. CT and labs requested per MD.

## 2018-03-14 NOTE — ED Notes (Signed)
[facility] - requested for RN to call back in approx 15 minutes.

## 2018-03-14 NOTE — ED Triage Notes (Signed)
[facility] Pt is c/o of upper abdominal pain x 2 days 7/10 dull ache. . Pt has h/x of pancreatitis, and reports to EMS flare ups have occurred with Ethol use. [facility]   EMS v/s BP 129/90 HR 80, RR 16, O2 98% RA. .Marland Kitchen

## 2018-03-14 NOTE — ED Provider Notes (Signed)
[facility] Provider Note   CSN: [id number] Arrival date & time: 03/14/18  1506     History   Chief Complaint Chief Complaint  Patient presents with  . Abdominal Pain    HPI Steven Mckenzie is a 22 y.o. male presenting for evaluation of epigastric abdominal pain.  Patient states that he was drinking heavily this weekend.  Since then, he has had epigastric abdominal pain.  He reports nausea without vomiting.  He states he has a history of pancreatitis, and has been diagnosed with a pseudocyst.  He was evaluated twice in the ER over the weekend, and sent to behavioral health for alcohol abuse.  He has not had any pain medicine or medicine for nausea since being transferred to behavioral health, and reports worsening epigastric pain.  He has not had anything to eat or drink today.  He denies fevers, chills, chest pain, shortness of breath, vomiting, urinary symptoms, abnormal bowel movements.  HPI  Past Medical History:  Diagnosis Date  . Alcohol abuse   . Pancreatitis     Patient Active Problem List   Diagnosis Date Noted  . MDD (major depressive disorder) 03/14/2018  . Normocytic anemia 10/01/2017  . Hypomagnesemia 10/01/2017  . 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 Alcohol withdrawal delirium, acute, hyperactive (HCC) 09/30/2017  . Alcohol abuse 09/30/2017  . Alcohol withdrawal (HCC) 09/30/2017  . Hypokalemia 05/17/2017    History reviewed. No pertinent surgical history.     Home Medications    Prior to Admission medications   Medication Sig Start Date End Date Taking? Authorizing Provider  chlordiazePOXIDE (LIBRIUM) 25 MG capsule Take 25 mg 4 Times Daily for 1 day (First dose given in the hospital), the the Next Day take 25 mg 3 Times Daily for 1 Day, Then 25 mg po BID, Then 25 mg Daily for 1 Day and then stop Day 5. Patient not taking: Reported on 03/13/2018 10/05/17   Marguerita MerlesSheikh, Omair Latif, DO  folic acid (FOLVITE) 1 MG tablet Take 1  tablet (1 mg total) by mouth daily. Patient not taking: Reported on 03/13/2018 10/05/17   Marguerita MerlesSheikh, Omair Latif, DO  lipase/protease/amylase (CREON) 12000 units CPEP capsule Take 1 capsule (12,000 Units total) by mouth 3 (three) times daily with meals. Patient not taking: Reported on 03/13/2018 10/05/17   Marguerita MerlesSheikh, Omair Latif, DO  pantoprazole (PROTONIX) 40 MG tablet Take 1 tablet (40 mg total) by mouth daily at 12 noon. Patient not taking: Reported on 03/13/2018 03/13/18   Antony MaduraHumes, Kelly, PA-C  pregabalin (LYRICA) 25 MG capsule Take 1 capsule (25 mg total) by mouth daily. Patient not taking: Reported on 03/13/2018 10/05/17   Marguerita MerlesSheikh, Omair Latif, DO  promethazine (PHENERGAN) 25 MG tablet Take 1 tablet (25 mg total) by mouth every 6 (six) hours as needed for nausea or vomiting. 03/13/18   Antony MaduraHumes, Kelly, PA-C  propranolol (INDERAL) 10 MG tablet Take 0.5 tablets (5 mg total) by mouth 2 (two) times daily. Patient not taking: Reported on 03/13/2018 10/05/17   Marguerita MerlesSheikh, Omair Latif, DO  thiamine 100 MG tablet Take 1 tablet (100 mg total) by mouth daily. Patient not taking: Reported on 03/13/2018 10/05/17   Merlene LaughterSheikh, Omair Latif, DO    Family History Family History  Problem Relation Age of Onset  . Alcohol abuse Mother   . Diabetes Mother   . Alcohol abuse Father   . Hypertension Father   . CAD Other   . Diabetes Other   . Hypertension Other   .  Stroke Other     Social History Social History   Tobacco Use  . Smoking status: Current Some Day Smoker    Packs/day: 0.15    Types: Cigarettes  . Smokeless tobacco: Never Used  Substance Use Topics  . Alcohol use: Yes    Comment: 1 bottle vodka per day  . Drug use: Yes    Types: Marijuana     Allergies   Banana; Celery oil; Percocet [oxycodone-acetaminophen]; and Pineapple   Review of Systems Review of Systems  Gastrointestinal: Positive for abdominal pain and nausea.  All other systems reviewed and are negative.    Physical Exam Updated Vital  Signs BP 113/86   Pulse 78   Temp 98 F (36.7 C) (Oral)   Resp (!) 1   Ht 5\' 10"  (1.778 m)   Wt 68.5 kg (151 lb)   SpO2 100%   BMI 21.67 kg/m   Physical Exam  Constitutional: He is oriented to person, place, and time. He appears well-developed and well-nourished. No distress.  HENT:  Head: Normocephalic and atraumatic.  Eyes: EOM are normal. Pupils are equal, round, and reactive to light.  Neck: Normal range of motion.  Cardiovascular: Normal rate, regular rhythm and intact distal pulses.  Pulmonary/Chest: Effort normal and breath sounds normal. No respiratory distress. He has no wheezes.  Abdominal: Soft. Bowel sounds are normal. He exhibits no distension and no mass. There is tenderness. There is no guarding.  TTP of epigastric abd. No rigidity or distension   Musculoskeletal: Normal range of motion.  Neurological: He is alert and oriented to person, place, and time.  Skin: Skin is warm and dry.  Psychiatric: He has a normal mood and affect.  Nursing note and vitals reviewed.    ED Treatments / Results  Labs (all labs ordered are listed, but only abnormal results are displayed) Labs Reviewed  COMPREHENSIVE METABOLIC PANEL - Abnormal; Notable for the following components:      Result Value   Chloride 99 (*)    AST 77 (*)    All other components within normal limits  URINALYSIS, ROUTINE W REFLEX MICROSCOPIC - Abnormal; Notable for the following components:   Color, Urine STRAW (*)    Ketones, ur 20 (*)    All other components within normal limits  LIPASE, BLOOD  CBC    EKG  EKG Interpretation None       Radiology Dg Abdomen Acute W/chest  Result Date: 03/14/2018 CLINICAL DATA:  Alcohol withdrawal. Epigastric pain. History of smoking. EXAM: DG ABDOMEN ACUTE W/ 1V CHEST COMPARISON:  None. FINDINGS: There is no evidence of dilated bowel loops or free intraperitoneal air. No radiopaque calculi or other significant radiographic abnormality is seen. Heart size and  mediastinal contours are within normal limits. Both lungs are clear. IMPRESSION: Negative abdominal radiographs.  No acute cardiopulmonary disease. Electronically Signed   By: Marin Roberts M.D.   On: 03/14/2018 18:01    Procedures Procedures (including critical care time)  Medications Ordered in ED Medications  alum & mag hydroxide-simeth (MAALOX/MYLANTA) 200-200-20 MG/5ML suspension 30 mL (not administered)  magnesium hydroxide (MILK OF MAGNESIA) suspension 30 mL (not administered)  hydrOXYzine (ATARAX/VISTARIL) tablet 25 mg (not administered)  traZODone (DESYREL) tablet 50 mg (50 mg Oral Given 03/14/18 2127)  famotidine (PEPCID) IVPB 20 mg premix (not administered)  ondansetron (ZOFRAN) injection 4 mg (not administered)  ketorolac (TORADOL) 15 MG/ML injection 15 mg (not administered)  LORazepam (ATIVAN) injection 0-4 mg ( Intravenous See Alternative 03/14/18 2126)  Or  LORazepam (ATIVAN) tablet 0-4 mg (1 mg Oral Given 03/14/18 2126)  thiamine (VITAMIN B-1) tablet 100 mg (not administered)    Or  thiamine (B-1) injection 100 mg (not administered)  famotidine (PEPCID) tablet 40 mg (40 mg Oral Not Given 03/14/18 2126)  ondansetron (ZOFRAN) tablet 4 mg (not administered)  acetaminophen (TYLENOL) tablet 1,000 mg (not administered)  traMADol (ULTRAM) tablet 50 mg (50 mg Oral Given 03/14/18 2127)  sodium chloride 0.9 % bolus 1,000 mL (0 mLs Intravenous Stopped 03/14/18 1915)  fentaNYL (SUBLIMAZE) injection 25 mcg (25 mcg Intravenous Given 03/14/18 1712)     Initial Impression / Assessment and Plan / ED Course  I have reviewed the triage vital signs and the nursing notes.  Pertinent labs & imaging results that were available during my care of the patient were reviewed by me and considered in my medical decision making (see chart for details).     Patient presenting for evaluation of epigastric abdominal pain and nausea.  Physical exam shows patient who is afebrile not tachycardic.   He appears uncomfortable due to pain, but in no acute distress.  Will obtain abdominal labs and give IVF, fentanyl, and zofran for sx control. CIWA protocol started.  Abdominal exam patient denies pain or tenderness only at the epigastric area.  Labs from the 17th and 18th reassuring.  I doubt CT scan will be necessary today.   Labs today are again reassuring, no leukocytosis, lipase normal, electrolytes and kidney function stable.  Liver enzymes stable.  Urine without signs of infection.  I do not believe CT scan will be beneficial.  Likely gastritis due to alcohol intake.  Discussed findings with patient.  Discussed importance of abstaining from alcohol and food choices.  Will obtain acute chest and abdomen film to look for free air and r/o perforation. Case discussed with attending, Dr. Rhunette Croft agrees to plan.   Xrays reassuring without signs of free air.  PO challenge without pain. Will transfer back to [facility] with daily pepcid, and zofran and pain medication prn. At this time, pt appears safe for d/c from the ED and to return to [facility]. Return precautions given. Pt states he understands and agrees to plan.  Final Clinical Impressions(s) / ED Diagnoses   Final diagnoses:  Epigastric abdominal pain    ED Discharge Orders    None       Alveria Apley, PA-C 03/14/18 2148    Derwood Kaplan, MD 03/16/18 1535

## 2018-03-14 NOTE — ED Notes (Signed)
Pt noted w/no tremors until he raised his arms to show "my tremors have improved" - pt then noted to be shaking hand mildly.

## 2018-03-14 NOTE — Progress Notes (Signed)
[facility] he started complaining of pain and stated that he has pancreatitis and he has been getting vicodin which is not ordered here.  Staff with Reola Calkinsravis Money NP about the situation.  Reviewed the lab work and tests done at the ED.  It was recommended to send back for lab work and scans since he has history of pancreatitis and pancreatic pseudo cyst.

## 2018-03-14 NOTE — ED Notes (Addendum)
Pt voiced agreement w/tx plan [facility]. Pt signed consent forms - [facility], copy sent to [facility] Records, and [facility]. Pt calling his mother from phone at nurses' desk advising of tx plan.

## 2018-03-14 NOTE — ED Notes (Signed)
Pt called out asking for pain meds; when RN arrived to provide meds pt asleep

## 2018-03-14 NOTE — ED Notes (Signed)
Pt is alert and oriented x 4 and is verbally responsive. Pt fnd in bed guarding abdomen. Pt reports generalized abdominal pain x 2 days 7/10 dull ache. Pt reports some nausea no vomiting or diarrhea at this time. Pt denies pain or burning with urination. Abdomen soft x 4 quads + BS.

## 2018-03-14 NOTE — Progress Notes (Signed)
[facility] Bed [facility]2 Steven SievertSpencer Simon, PA is the accepting provider.  Dr. Jama Flavorsobos is the attending provider.  Call report to [phone]  [facility].   Pt is Voluntary.  Pt may be transported by [facility]  Pt scheduled  to arrive as soon as transport can be arranged.  Timmothy EulerJean T. Kaylyn LimSutter, MSW, LCSWA Disposition Clinical Social Work [phone] (cell) [phone] (office)

## 2018-03-14 NOTE — Progress Notes (Signed)
Luana Shurevis is a 22 year old male being admitted voluntarily to 304-2 from [facility].  He came in for help with alcohol withdrawal.  [facility], he denies SI/HI or A/V hallucinations.  Affect depressed.  He stated that he was doing good with the alcohol and had a recent relapse.  He is drinking 3 or more pints of liquor daily and last drink was 03/12/18.  He reported history of pancreatitis and is complaining of mid abdominal pain 7/10.  "I was getting vicodin at the emergency room and I need more right now because I am really hurting."  Oriented him to the unit.  Admission paperwork completed and signed.  Belongings searched and secured in locker # 38, no contraband found.  Skin assessment completed and no skin issues noted.  Q 15 minute checks initiated for safety.  We will continue to monitor the progress towards his goals.

## 2018-03-14 NOTE — Tx Team (Signed)
Initial Treatment Plan 03/14/2018 2:47 PM Steven Mckenzie Brubacher ZOX:[id number]    PATIENT STRESSORS: Financial difficulties Health problems Substance abuse   PATIENT STRENGTHS: Capable of independent living General fund of knowledge Motivation for treatment/growth Supportive family/friends   PATIENT IDENTIFIED PROBLEMS: Substance abuse  Depression  "Get my body right and be free from pain and this shaking"  "Not to have to come back here again"               DISCHARGE CRITERIA:  Improved stabilization in mood, thinking, and/or behavior Verbal commitment to aftercare and medication compliance Withdrawal symptoms are absent or subacute and managed without 24-hour nursing intervention  PRELIMINARY DISCHARGE PLAN: Outpatient therapy Medication management  PATIENT/FAMILY INVOLVEMENT: This treatment plan has been presented to and reviewed with the patient, Steven Mckenzie Paulsen.  The patient and family have been given the opportunity to ask questions and make suggestions.  Levin BaconHeather V Erinn Huskins, RN 03/14/2018, 2:47 PM

## 2018-03-14 NOTE — ED Notes (Signed)
Patient was given a snack and drink. A Regular Diet was ordered for Lunch. 

## 2018-03-14 NOTE — ED Notes (Signed)
Upon my arrival pt was not placed in paper scrubs nor his belongings taken from him,. He had not been wanded. Pt had been TTS and was waiting for placement.

## 2018-03-14 NOTE — Progress Notes (Signed)
Patient ID: Steven Mckenzie, male   DOB: [birth year], 22 y.o.   MRN: [id number]  D: Patient return from ED very anxious. Pt was in his room on approach reporting upper abdominal pain.  Pt reports pain level as 7 on a 0-10 scale. Pt mood and affect appeared anxious. Pt denies SI/HI/AVH.  Cooperative with assessment.   A: Medications administered as prescribed. Support and encouragement provided to attend groups and engage in milieu. Pt encouraged to discuss feelings and come to staff with any question or concerns.   R: Patient remains safe and complaint with medications.

## 2018-03-14 NOTE — Discharge Instructions (Signed)
Take Pepcid daily. Use Maalox as needed for abdominal pain/burning. Use Tylenol as needed for mild to moderate pain.  Use tramadol as needed for severe pain. Use Zofran as needed for nausea or vomiting. Be careful with your food choices over the next several days, avoid spicy, acidic, or fatty foods.   It is important that you stop drinking, as this will cause continued stomach irritation. Follow-up with a primary care doctor.  There is information for [facility] and wellness attached if you need to establish care.  Return to the emergency room if you develop fevers, worsening pain, persistent vomiting, or any new or concerning symptoms.

## 2018-03-15 DIAGNOSIS — G4700 Insomnia, unspecified: Secondary | ICD-10-CM

## 2018-03-15 DIAGNOSIS — R109 Unspecified abdominal pain: Secondary | ICD-10-CM

## 2018-03-15 DIAGNOSIS — R251 Tremor, unspecified: Secondary | ICD-10-CM

## 2018-03-15 DIAGNOSIS — F330 Major depressive disorder, recurrent, mild: Secondary | ICD-10-CM

## 2018-03-15 DIAGNOSIS — Z811 Family history of alcohol abuse and dependence: Secondary | ICD-10-CM

## 2018-03-15 DIAGNOSIS — F1020 Alcohol dependence, uncomplicated: Secondary | ICD-10-CM

## 2018-03-15 MED ORDER — LORAZEPAM 1 MG PO TABS: 1.0000 mg | ORAL_TABLET | Freq: Four times a day (QID) | ORAL | Status: DC | PRN

## 2018-03-15 MED ORDER — LOPERAMIDE HCL 2 MG PO CAPS: 2.0000 mg | ORAL_CAPSULE | ORAL | Status: DC | PRN

## 2018-03-15 MED ORDER — ONDANSETRON 4 MG PO TBDP: 4.0000 mg | ORAL_TABLET | Freq: Four times a day (QID) | ORAL | Status: DC | PRN

## 2018-03-15 MED ORDER — MIRTAZAPINE 7.5 MG PO TABS
7.5000 mg | ORAL_TABLET | Freq: Every day | ORAL | Status: DC
  Administered 2018-03-15: 7.5 mg via ORAL
  Filled 2018-03-15 (×4): qty 1

## 2018-03-15 MED ORDER — LORAZEPAM 1 MG PO TABS: 1.0000 mg | ORAL_TABLET | Freq: Two times a day (BID) | ORAL | Status: DC

## 2018-03-15 MED ORDER — VITAMIN B-1 100 MG PO TABS
100.0000 mg | ORAL_TABLET | Freq: Every day | ORAL | Status: DC
  Filled 2018-03-15 (×3): qty 1

## 2018-03-15 MED ORDER — PANCRELIPASE (LIP-PROT-AMYL) 12000-38000 UNITS PO CPEP
12000.0000 [IU] | ORAL_CAPSULE | Freq: Three times a day (TID) | ORAL | Status: DC
  Administered 2018-03-15: 12000 [IU] via ORAL
  Filled 2018-03-15 (×3): qty 1

## 2018-03-15 MED ORDER — LORAZEPAM 1 MG PO TABS
1.0000 mg | ORAL_TABLET | Freq: Four times a day (QID) | ORAL | Status: AC
  Administered 2018-03-15 (×3): 1 mg via ORAL
  Filled 2018-03-15 (×3): qty 1

## 2018-03-15 MED ORDER — LORAZEPAM 1 MG PO TABS: 1.0000 mg | ORAL_TABLET | Freq: Every day | ORAL | Status: DC

## 2018-03-15 MED ORDER — ADULT MULTIVITAMIN W/MINERALS CH
1.0000 | ORAL_TABLET | Freq: Every day | ORAL | Status: DC
  Administered 2018-03-15 – 2018-03-16 (×2): 1 via ORAL
  Filled 2018-03-15 (×5): qty 1

## 2018-03-15 MED ORDER — HYDROXYZINE HCL 25 MG PO TABS: 25.0000 mg | ORAL_TABLET | Freq: Four times a day (QID) | ORAL | Status: DC | PRN

## 2018-03-15 MED ORDER — PROPRANOLOL HCL 10 MG PO TABS
5.0000 mg | ORAL_TABLET | Freq: Two times a day (BID) | ORAL | Status: DC
  Administered 2018-03-15 – 2018-03-16 (×2): 5 mg via ORAL
  Filled 2018-03-15 (×6): qty 0.5

## 2018-03-15 MED ORDER — LORAZEPAM 1 MG PO TABS
1.0000 mg | ORAL_TABLET | Freq: Three times a day (TID) | ORAL | Status: DC
  Administered 2018-03-16: 1 mg via ORAL
  Filled 2018-03-15: qty 1

## 2018-03-15 MED ORDER — FOLIC ACID 1 MG PO TABS
1.0000 mg | ORAL_TABLET | Freq: Every day | ORAL | Status: DC
  Administered 2018-03-15 – 2018-03-16 (×2): 1 mg via ORAL
  Filled 2018-03-15 (×4): qty 1

## 2018-03-15 NOTE — [facility] Group Notes (Signed)
LCSW Group Therapy Note 03/15/2018 3:11 PM  Type of Therapy and Topic: Group Therapy: Avoiding Self-Sabotaging and Enabling Behaviors  Participation Level: Active  Description of Group:  In this group, patients will learn how to identify obstacles, self-sabotaging and enabling behaviors, as well as: what are they, why do we do them and what needs these behaviors meet. Discuss unhealthy relationships and how to have positive healthy boundaries with those that sabotage and enable. Explore aspects of self-sabotage and enabling in yourself and how to limit these self-destructive behaviors in everyday life.  Therapeutic Goals: 1. Patient will identify one obstacle that relates to self-sabotage and enabling behaviors 2. Patient will identify one personal self-sabotaging or enabling behavior they did prior to admission 3. Patient will state a plan to change the above identified behavior 4. Patient will demonstrate ability to communicate their needs through discussion and/or role play.   Summary of Patient Progress:  Luana Shurevis was engaged and participated throughout the group session. Khamani contributed and defined self sabotaging behaviors by using an example. The example he shared with the group was "driving while drinking" as his self sabotaging behavior. Juwaun reports that he also has an issue with "being too proud to ask for help". Kason states that he plans to continue to follow up with his program [facility]" so that he can continue to work on his sobriety.    Therapeutic Modalities:  Cognitive Behavioral Therapy Person-Centered Therapy Motivational Interviewing   Baldo DaubJolan Darlisha Kelm LCSWA Clinical Social Worker

## 2018-03-15 NOTE — [facility] Group Notes (Signed)
[facility] Group Notes:  (Nursing/MHT/Case Management/Adjunct)  Date:  03/15/2018  Time:  6:29 PM  Type of Therapy:  Psychoeducational Skills  Participation Level:  Active  Participation Quality:  Appropriate  Affect:  Appropriate  Cognitive:  Appropriate  Insight:  Appropriate  Engagement in Group:  Engaged  Modes of Intervention:  Problem-solving  Summary of Progress/Problems: Pt talked about their coping skills and their experience here in the hospital.

## 2018-03-15 NOTE — [facility] Suicide Risk Assessment (Signed)
[facility] Admission Suicide Risk Assessment   Nursing information obtained from:  Patient Demographic factors:  Male, Low socioeconomic status Current Mental Status:  NA Loss Factors:  Legal issues, Financial problems / change in socioeconomic status Historical Factors:  NA Risk Reduction Factors:  NA  Total Time spent with patient: 1 hour Principal Problem: Alcohol use disorder, severe, dependence (HCC) Diagnosis:   Patient Active Problem List   Diagnosis Date Noted  . Alcohol use disorder, severe, dependence (HCC) [F10.20] 03/15/2018  . MDD (major depressive disorder) [F32.9] 03/14/2018  . Normocytic anemia [D64.9] 10/01/2017  . Hypomagnesemia [E83.42] 10/01/2017  . 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 Alcohol withdrawal delirium, acute, hyperactive (HCC) [F10.231] 09/30/2017  . Alcohol abuse [F10.10] 09/30/2017  . Alcohol withdrawal (HCC) [F10.239] 09/30/2017  . Hypokalemia [E87.6] 05/17/2017   Subjective Data:   Steven Mckenzie is a 22 y/o M with history of alcohol use disorder and treatment for depression who was admitted from ED after he initially presented with epigastric pain in the context of relapse of use of alcohol. Pt had been attending [facility] for [facility] use treatment, and he reports he relapsed with use of alcohol after 2 weeks of sobriety. He reported drinking at least "ten shots" on St. Patrick[place] Day with friends and then he had onset of epigastric pain which pt associated with previous diagnoses of pancreatitis and pancreatic cyst. Pt was concerned about symptoms of alcohol withdrawal and he was transferred to [facility] for additional treatment. Pt had ongoing nausea and epigastric pain, and he was sent back to ED for evaluation. He was evaluated to likely have gastritis associated with alcohol use and he was returned to [facility] for additional treatment.  Upon evaluation, pt shares, "I came to [facility] ED first because I have pancreatitis and I was having  stomach pain. I got out but when I was waiting for my medications I started to shake from the pain, and I didn't want to have a seizure, so I came back." Pt denies symptoms of depression, mania, OCD, and PTSD. He denies sleep disturbance. His appetite is good. He shares that he only used alcohol once after maintaining sobriety for the past 2 weeks, and he has been in the [facility] program for the past 10 weeks. He denies history of mental illness. He denies illicit substance use aside from alcohol.  Discussed with patient about treatment options. He agrees to be monitored on the alcohol withdrawal protocol, and he would like to return to [facility] once he has detoxed. He denies depression and anxiety, but he has some mild difficulty with getting to sleep and he would like to try something different than trazodone, so he agrees to trial of low dose of remeron. He had no further questions, comments, or concerns.  Continued Clinical Symptoms:  Alcohol Use Disorder Identification Test Final Score (AUDIT): 34 The "Alcohol Use Disorders Identification Test", Guidelines for Use in Primary Care, Second Edition.  World Science writer [facility]). Score between 0-7:  no or low risk or alcohol related problems. Score between 8-15:  moderate risk of alcohol related problems. Score between 16-19:  high risk of alcohol related problems. Score 20 or above:  warrants further diagnostic evaluation for alcohol dependence and treatment.   CLINICAL FACTORS:   Alcohol/Substance Abuse/Dependencies   Musculoskeletal: Strength & Muscle Tone: within normal limits Gait & Station: normal Patient leans: N/A  Psychiatric Specialty Exam: Physical Exam  Nursing note and vitals reviewed.   Review  of Systems  Constitutional: Negative for chills and fever.  Respiratory: Negative for cough and shortness of breath.   Gastrointestinal: Negative for abdominal pain, heartburn, nausea and vomiting.   Psychiatric/Behavioral: Negative for depression, hallucinations and suicidal ideas. The patient is not nervous/anxious.     Blood pressure 115/78, pulse 89, temperature (!) 97.3 F (36.3 C), temperature source Oral, resp. rate 16, height 5\' 10"  (1.778 m), weight 68.5 kg (151 lb), SpO2 100 %.Body mass index is 21.67 kg/m.  General Appearance: Casual and Fairly Groomed  Eye Contact:  Good  Speech:  Clear and Coherent and Normal Rate  Volume:  Normal  Mood:  Euthymic  Affect:  Congruent and Full Range  Thought Process:  Coherent and Goal Directed  Orientation:  Full (Time, Place, and Person)  Thought Content:  Logical  Suicidal Thoughts:  No  Homicidal Thoughts:  No  Memory:  Immediate;   Fair Recent;   Fair Remote;   Fair  Judgement:  Poor  Insight:  Lacking  Psychomotor Activity:  Normal  Concentration:  Concentration: Fair  Recall:  FiservFair  Fund of Knowledge:  Fair  Language:  Fair  Akathisia:  No  Handed:    AIMS (if indicated):     Assets:  Communication Skills Resilience  ADL's:  Intact  Cognition:  WNL  Sleep:  Number of Hours: 6      COGNITIVE FEATURES THAT CONTRIBUTE TO RISK:  None    SUICIDE RISK:   Minimal: No identifiable suicidal ideation.  Patients presenting with no risk factors but with morbid ruminations; may be classified as minimal risk based on the severity of the depressive symptoms  PLAN OF CARE:   -admit to inpatient level of care  - Alcohol use disorder   - Continue CIWA with ativan  -Insomnia   - DC trazodone   - Start remeron 7.5mg  po qhs  -Anxiety   - Start atarax 25mg  po q6h prn anxiety   - Continue propranolol 5mg  po BID  -gastric pain   - Continue pepcid 40mg  po qDay   - continue tramadol 50mg  po q12h prn severe pain  - Encourage participation in groups and therapeutic milieu  - Disposition planning will be ongoing  I certify that inpatient services furnished can reasonably be expected to improve the patient's condition.    Micheal Likenshristopher T Jadi Deyarmin, MD 03/15/2018, 4:14 PM

## 2018-03-15 NOTE — [facility] Suicide Risk Assessment (Signed)
[facility] INPATIENT:  Family/Significant Other Suicide Prevention Education  Suicide Prevention Education:  Education Completed; Bridgette Habermannngela Ransom [phone] (pt's mother) has been identified by the patient as the family member/significant other with whom the patient will be residing, and identified as the person(s) who will aid the patient in the event of a mental health crisis (suicidal ideations/suicide attempt).  With written consent from the patient, the family member/significant other has been provided the following suicide prevention education, prior to the and/or following the discharge of the patient.  The suicide prevention education provided includes the following:  Suicide risk factors  Suicide prevention and interventions  National Suicide Hotline telephone number  [facility] assessment telephone number  [facility] Emergency Assistance 911  [facility] and/or Residential Mobile Crisis Unit telephone number  Request made of family/significant other to:  Remove weapons (e.g., guns, rifles, knives), all items previously/currently identified as safety concern.    Remove drugs/medications (over-the-counter, prescriptions, illicit drugs), all items previously/currently identified as a safety concern.  The family member/significant other verbalizes understanding of the suicide prevention education information provided.  The family member/significant other agrees to remove the items of safety concern listed above.  SPE and aftercare options reviewed with pt's mother. She denied that pt has access to weapons/firearms and has no safety concerns regarding pt returning to his home at discharge. However, she noted that "I don't like his friends. They are not a good influence on him." She also requested that CSW review alternative options for SAIOP other than [facility]. "Maybe something [employer] based." CSW assessing.   Ladarrell Cornwall N Smart LCSW 03/15/2018, 4:03 PM

## 2018-03-15 NOTE — Progress Notes (Signed)
Recreation Therapy Notes  Date: 3.20.19 Time: 9:30 a.m. Location: [address] Dayroom  Group Topic: Stress Management  Goal Area(s) Addresses:  Goal 1.1: To reduce stress  -Patient will report feeling a reduction in stress level  -Patient will identify the importance of stress management  -Patient will participate during stress management group treatment    Intervention: Stress Management  Activity: Guided Imagery- Patients were in a peaceful environment with soft lighting enhancing patients mood. Patients were read a guided imagery script to promote relaxation   Education: Stress Management, Discharge Planning.   Education Outcome: Acknowledges edcuation/In group clarification offered/Needs additional education  Clinical Observations/Feedback:: Patient did not attend   Sheryle HailDarian Naje Rice, Recreation Therapy Intern   Sheryle HailDarian Whittaker Lenis 03/15/2018 10:55 AM

## 2018-03-15 NOTE — Progress Notes (Signed)
DAR NOTE: Patient presents with calm affect and pleasant mood. Pt stated he is feeling better to day than yesterday, pt stated he is not in pain anymore in his stomach. Pt is pleased with his medications regiment. Reports poor sleep last night, good appetite, low energy and good concentration. Denies pain, auditory and visual hallucinations.  Rates depression at 0, hopelessness at 0, and anxiety at 0.  Maintained on routine safety checks.  Medications given as prescribed.  Support and encouragement offered as needed.  Attended group and participated.  States goal for today is " to continue to be positive and stay on a good route towards my recovery."  Patient observed socializing with peers in the dayroom.  Offered no complaint.

## 2018-03-15 NOTE — [facility] Counselor (Signed)
Adult Comprehensive Assessment  Patient ID: Steven Mckenzie, male   DOB: 12/26/1996, 22 y.o.   MRN: [id number]  Information Source: Information source: Patient  Current Stressors:   alcohol relapse 10 weeks ago Dropped out of college due to relapse Chronic history of alcohol addiction  Living/Environment/Situation:  Living Arrangements: Other relatives Living conditions (as described by patient or guardian): lives with 2 roomates in apt How long has patient lived in current situation?: October 2018 What is atmosphere in current home: Comfortable, Supportive, Loving  Family History:  Marital status: Single Are you sexually active?: Yes What is your sexual orientation?: heterosexual Has your sexual activity been affected by drugs, alcohol, medication, or emotional stress?: n/a  Does patient have children?: No  Childhood History:  By whom was/is the patient raised?: Both parents Additional childhood history information: The whole family raised me and I would go back and forth between my parents. They were divorced Description of patient's relationship with caregiver when they were a child: Closer to mother; good relationship with dad and other extended family Patient's description of current relationship with people who raised him/her: close to both parents. [place]  How were you disciplined when you got in trouble as a child/adolescent?: whoopins Does patient have siblings?: Yes Number of Siblings: 2 Description of patient's current relationship with siblings: Brother older; younger sister- "we get along well. My [place]  Did patient suffer any verbal/emotional/physical/sexual abuse as a child?: No Did patient suffer from severe childhood neglect?: No Has patient ever been sexually abused/assaulted/raped as an adolescent or adult?: No Was the patient ever a victim of a crime or a disaster?: No Witnessed domestic violence?: Yes Has  patient been effected by domestic violence as an adult?: No Description of domestic violence: I've seen people in the neighborhood fighting but never my family members  Education:  Highest grade of school patient has completed: graudated high school. taking a break this semester.  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  How long has patient been employed?: one month  Patient's job has been impacted by current illness: No What is the longest time patient has a held a job?: 1year  Where was the patient employed at that time?: Engineer, [employer] in [place]  Has patient ever been in the [employer] and Companymilitary?: No Has patient ever served in combat?: No Did You Receive Any Psychiatric Treatment/Services While in Equities traderthe Military?: No Are There Guns or Other Weapons in Your Home?: No Are These ComptrollerWeapons Safely Secured?: (n/a)  Financial Resources:   Financial resources: Income from employment, Media plannerrivate insurance, Support from parents / caregiver Does patient have a Lawyerrepresentative payee or guardian?: No  Alcohol/Substance Abuse:   What has been your use of drugs/alcohol within the last 12 months?: alcohol relapse--10 weeks ago. steadily increasing. hx of marijuana abuse but not recently. alcohol-liquor throughout the day.  If attempted suicide, did drugs/alcohol play a role in this?: No Alcohol/Substance Abuse Treatment Hx: Past detox, Past Tx, Outpatient If yes, describe treatment[facility] October [facility]; [facility] Recently at [facility] for past 10 weeks.  Has alcohol/substance abuse ever caused legal problems?: No(history of DUI--November 2017)  Social Support System:   Patient's Community Support System: Good Describe Community  Support System: good group of supportive friends and family Type of faith/religion: Ephriam Knuckles How does patient's faith help to cope with current illness?: prayer; attends church  Leisure/Recreation:   Leisure and Hobbies: video games; basketball; go shopping; cook; "I do alot of things."    Strengths/Needs:   What things does the patient do well?: "I can stay positive and fake it till I make it."  In what areas does patient struggle / problems for patient: "I struggle to stop drinking; if I have the motivation I can stop." "My environement-clubs and college setting."   Discharge Plan:   Does patient have access to transportation?: Yes(drive car) Will patient be returning to same living situation after discharge?: Yes(home with roomates in [place], [place]) Currently receiving community mental health services: Yes (From Whom)([facility] SAIOP) If no, would patient like referral for services when discharged?: Yes (What county?)([place]) Does patient have financial barriers related to discharge medications?: No(BCBS insurance)  Summary/Recommendations:   Emergency planning/management officer and Recommendations (to be completed by the evaluator): Patient is 21yo male living in [place], [place] ([place] he presents to the hospital seeking treatment for alcohol abuse/detox, increased mood lability, and for medication stabilization. Pt's goal is to "detox safely in the hospital." Patient has a diagnosis of MDD and Alcohol use disorder. He reports that he relapsed about 10 weeks ago. He is single, with no children, employed, and plans to resume college courses in the summer of this year. He denies SI/HI/AVH currently. Pt attends SAIOP at [facility] is may require psychiatry referral if put on psychiatric medication. Recommendations for patient include: crisis stabilization, therapeutic milieu, encourage group attendance and participation, medication management for detox/mood stabilization, and development of  comrpehensive mental wellness/sobriety plan. CSW assessing for appropriate referrals.   Ledell Peoples Smart LCSW 03/15/2018 9:53 AM
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
## 2018-03-15 NOTE — Tx Team (Signed)
Interdisciplinary Treatment and Diagnostic Plan Update  03/15/2018 Time of Session: 0830AM [name] MRN: [id number]  Principal Diagnosis: MDD, recurrent, severe  Secondary Diagnoses: Active Problems:   MDD (major depressive disorder)   Current Medications:  Current Facility-Administered Medications  Medication Dose Route Frequency Provider Last Rate Last Dose  . acetaminophen (TYLENOL) tablet 1,000 mg  1,000 mg Oral Q8H PRN Caccavale, Sophia, PA-C      . alum & mag hydroxide-simeth (MAALOX/MYLANTA) 200-200-20 MG/5ML suspension 30 mL  30 mL Oral Q4H PRN Okonkwo, Justina A, NP      . famotidine (PEPCID) IVPB 20 mg premix  20 mg Intravenous Once Caccavale, Sophia, PA-C      . famotidine (PEPCID) tablet 40 mg  40 mg Oral Daily Caccavale, Sophia, PA-C      . hydrOXYzine (ATARAX/VISTARIL) tablet 25 mg  25 mg Oral TID PRN Okonkwo, Justina A, NP      . ketorolac (TORADOL) 15 MG/ML injection 15 mg  15 mg Intravenous Once Caccavale, Sophia, PA-C      . LORazepam (ATIVAN) injection 0-4 mg  0-4 mg Intravenous Q6H Caccavale, Sophia, PA-C       Or  . LORazepam (ATIVAN) tablet 0-4 mg  0-4 mg Oral Q6H Caccavale, Sophia, PA-C   1 mg at 03/14/18 2126  . magnesium hydroxide (MILK OF MAGNESIA) suspension 30 mL  30 mL Oral Daily PRN Okonkwo, Justina A, NP      . ondansetron (ZOFRAN) injection 4 mg  4 mg Intravenous Once Caccavale, Sophia, PA-C      . ondansetron (ZOFRAN) tablet 4 mg  4 mg Oral Q8H PRN Caccavale, Sophia, PA-C      . thiamine (VITAMIN B-1) tablet 100 mg  100 mg Oral Daily Caccavale, Sophia, PA-C       Or  . thiamine (B-1) injection 100 mg  100 mg Intravenous Daily Caccavale, Sophia, PA-C      . traMADol (ULTRAM) tablet 50 mg  50 mg Oral Q12H PRN Caccavale, Sophia, PA-C   50 mg at 03/14/18 2127  . traZODone (DESYREL) tablet 50 mg  50 mg Oral QHS PRN Lu Duffel, Justina A, NP   50 mg at 03/14/18 2127   PTA Medications: Medications Prior to Admission  Medication Sig Dispense Refill Last  Dose  . chlordiazePOXIDE (LIBRIUM) 25 MG capsule Take 25 mg 4 Times Daily for 1 day (First dose given in the hospital), the the Next Day take 25 mg 3 Times Daily for 1 Day, Then 25 mg po BID, Then 25 mg Daily for 1 Day and then stop Day 5. (Patient not taking: Reported on 03/13/2018) 13 capsule 0 Not Taking at Unknown time  . folic acid (FOLVITE) 1 MG tablet Take 1 tablet (1 mg total) by mouth daily. (Patient not taking: Reported on 03/13/2018) 30 tablet 0 Not Taking at Unknown time  . lipase/protease/amylase (CREON) 12000 units CPEP capsule Take 1 capsule (12,000 Units total) by mouth 3 (three) times daily with meals. (Patient not taking: Reported on 03/13/2018) 270 capsule 0 Not Taking at Unknown time  . pantoprazole (PROTONIX) 40 MG tablet Take 1 tablet (40 mg total) by mouth daily at 12 noon. (Patient not taking: Reported on 03/13/2018) 30 tablet 0 Not Taking at Unknown time  . pregabalin (LYRICA) 25 MG capsule Take 1 capsule (25 mg total) by mouth daily. (Patient not taking: Reported on 03/13/2018) 30 capsule 0 Not Taking at Unknown time  . promethazine (PHENERGAN) 25 MG tablet Take 1 tablet (25 mg total) by  mouth every 6 (six) hours as needed for nausea or vomiting. 12 tablet 0   . propranolol (INDERAL) 10 MG tablet Take 0.5 tablets (5 mg total) by mouth 2 (two) times daily. (Patient not taking: Reported on 03/13/2018) 60 tablet 0 Not Taking at Unknown time  . thiamine 100 MG tablet Take 1 tablet (100 mg total) by mouth daily. (Patient not taking: Reported on 03/13/2018) 30 tablet 0 Not Taking at Unknown time    Patient Stressors: Financial difficulties Health problems Substance abuse  Patient Strengths: Capable of independent living General fund of knowledge Motivation for treatment/growth Supportive family/friends  Treatment Modalities: Medication Management, Group therapy, Case management,  1 to 1 session with clinician, Psychoeducation, Recreational therapy.   Physician Treatment Plan for  Primary Diagnosis: MDD, recurrent, severe  Medication Management: Evaluate patient's response, side effects, and tolerance of medication regimen.  Therapeutic Interventions: 1 to 1 sessions, Unit Group sessions and Medication administration.  Evaluation of Outcomes: Not Met  Physician Treatment Plan for Secondary Diagnosis: Active Problems:   MDD (major depressive disorder)  Medication Management: Evaluate patient's response, side effects, and tolerance of medication regimen.  Therapeutic Interventions: 1 to 1 sessions, Unit Group sessions and Medication administration.  Evaluation of Outcomes: Not Met   RN Treatment Plan for Primary Diagnosis:MDD, recurrent, severe Long Term Goal(s): Knowledge of disease and therapeutic regimen to maintain health will improve  Short Term Goals: Ability to remain free from injury will improve, Ability to demonstrate self-control, Ability to disclose and discuss suicidal ideas and Ability to identify and develop effective coping behaviors will improve  Medication Management: RN will administer medications as ordered by provider, will assess and evaluate patient's response and provide education to patient for prescribed medication. RN will report any adverse and/or side effects to prescribing provider.  Therapeutic Interventions: 1 on 1 counseling sessions, Psychoeducation, Medication administration, Evaluate responses to treatment, Monitor vital signs and CBGs as ordered, Perform/monitor CIWA, COWS, AIMS and Fall Risk screenings as ordered, Perform wound care treatments as ordered.  Evaluation of Outcomes: Not Met   LCSW Treatment Plan for Primary Diagnosis: MDD, recurrent, severe Long Term Goal(s): Safe transition to appropriate next level of care at discharge, Engage patient in therapeutic group addressing interpersonal concerns.  Short Term Goals: Engage patient in aftercare planning with referrals and resources and Facilitate patient progression  through stages of change regarding substance use diagnoses and concerns  Therapeutic Interventions: Assess for all discharge needs, 1 to 1 time with Social worker, Explore available resources and support systems, Assess for adequacy in community support network, Educate family and significant other(s) on suicide prevention, Complete Psychosocial Assessment, Interpersonal group therapy.  Evaluation of Outcomes: Not Met   Progress in Treatment: Attending groups: No. Participating in groups: No.New to unit. Continuing to assess.  Taking medication as prescribed: Yes. Toleration medication: Yes. Family/Significant other contact made: No, will contact:  family member if pt consents for collateral information and to complete SPE. Patient understands diagnosis: Yes. Discussing patient identified problems/goals with staff: Yes. Medical problems stabilized or resolved: Yes. Denies suicidal/homicidal ideation: Yes. Issues/concerns per patient self-inventory: No. Other: n/a   New problem(s) identified: No, Describe:  n/a  New Short Term/Long Term Goal(s): detox, medication management for mood stabilization; elimination of SI thoughts; development of comprehensive mental wellness/sobriety plan.   Patient Goal: "to be monitored while I detox from alcohol."   Discharge Plan or Barriers: CSW assessing for appropriate referrals. [place] pamphlet and AA/NA list to be provided to pt for additional  community support.   Reason for Continuation of Hospitalization: Anxiety Depression Medication stabilization Suicidal ideation Withdrawal symptoms  Estimated Length of Stay: Monday, 03/20/18  Attendees: Patient: Steven Mckenzie  03/15/2018 9:01 AM  Physician: Dr. Parke Poisson MD; Dr. Nancy Fetter MD 03/15/2018 9:01 AM  Nursing: Opal Sidles RN; Doris RN 03/15/2018 9:01 AM  RN Care Manager:x 03/15/2018 9:01 AM  Social Worker: Press photographer, LCSW 03/15/2018 9:01 AM  Recreational Therapist: x 03/15/2018 9:01 AM  Other: Lindell Spar NP; Darnelle Maffucci Money NP 03/15/2018 9:01 AM  Other:  03/15/2018 9:01 AM  Other: 03/15/2018 9:01 AM    Scribe for Treatment Team: Kimber Relic Smart, LCSW 03/15/2018 9:01 AM

## 2018-03-16 DIAGNOSIS — F1290 Cannabis use, unspecified, uncomplicated: Secondary | ICD-10-CM

## 2018-03-16 DIAGNOSIS — F17210 Nicotine dependence, cigarettes, uncomplicated: Secondary | ICD-10-CM

## 2018-03-16 MED ORDER — MIRTAZAPINE 7.5 MG PO TABS: 7.5000 mg | ORAL_TABLET | Freq: Every day | ORAL | 1 refills | Status: AC

## 2018-03-16 MED ORDER — PROPRANOLOL HCL 10 MG PO TABS: 5.0000 mg | ORAL_TABLET | Freq: Two times a day (BID) | ORAL | 1 refills | Status: AC

## 2018-03-16 MED ORDER — HYDROXYZINE HCL 25 MG PO TABS: ORAL_TABLET | ORAL | 1 refills | Status: AC

## 2018-03-16 MED ORDER — FAMOTIDINE 40 MG PO TABS: 40.0000 mg | ORAL_TABLET | Freq: Every day | ORAL | 1 refills | Status: DC

## 2018-03-16 NOTE — Progress Notes (Signed)
D: Patient seen interacting and socializing well on unit. Patient stated his day was good and was happy with the treatment he has received so far. Denies pain, SI/HI, AH/VH at this time. Verbalized no concern. No behavior issues noted.  A: Staff offered support and encouragement as needed. Routine safety check maintained. Will continue to monitor  atient. R: Patient is safe on unit.

## 2018-03-16 NOTE — Progress Notes (Signed)
  [facility] Discharge Plan :  Will you be returning to the same living situation after discharge:  Yes,  home At discharge, do you have transportation home?: Yes, mother Do you have the ability to pay for your medications: Yes,  [employer] insurance  Release of information consent forms completed and submitted to medical records by CSW.  Patient to Follow up at: Follow-up Information    [facility] Follow up.   Why:  Please resume your SAIOP schedule immediately at discharge. Thank you.  Contact information: [address] [place] [id number] [phone]        [facility] Follow up on 05/17/2018.   Specialty:  Behavioral Health Why:  Medication management appt on Wed, 5/22 at 10:00AM with Dr. Lolly MustacheArfeen. Please bring: photo ID and insurance card to this appt. Thank you.  Contact information: [address] [place] [phone]          Next level of care provider has access to [facility] Link:no  Safety Planning and Suicide Prevention discussed: Yes,  SPE completed with pt's mother and with pt. [facility] pamphlet and Mobile Crisis information also provided for additional community support.  Have you used any form of tobacco in the last 30 days? (Cigarettes, Smokeless Tobacco, Cigars, and/or Pipes): No  Has patient been referred to the Quitline?: N/A  Patient has been referred for addiction treatment: Yes  Pulte HomesHeather N Smart, LCSW 03/16/2018, 1:04 PM

## 2018-03-16 NOTE — Discharge Summary (Addendum)
Physician Discharge Summary Note  Patient:  Steven Mckenzie is an 22 y.o., male MRN:  [id number] DOB:  Nov 20, 1996 Patient phone:  [phone] (home)  Patient address:   [address] [place] [address],  Total Time spent with patient: 30 minutes  Date of Admission:  03/14/2018 Date of Discharge: 03-16-18  Reason for Admission: Complaints of epigastric pain & increased alcohol use.  Principal Problem: Alcohol use disorder, severe, dependence [facility])  Discharge Diagnoses: Patient Active Problem List   Diagnosis Date Noted  . Alcohol use disorder, severe, dependence (HCC) [F10.20] 03/15/2018  . MDD (major depressive disorder) [F32.9] 03/14/2018  . Normocytic anemia [D64.9] 10/01/2017  . Hypomagnesemia [E83.42] 10/01/2017  . 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 Alcohol withdrawal delirium, acute, hyperactive (HCC) [F10.231] 09/30/2017  . Alcohol abuse [F10.10] 09/30/2017  . Alcohol withdrawal (HCC) [F10.239] 09/30/2017  . Hypokalemia [E87.6] 05/17/2017   Past Psychiatric History: Alcoholism Past Medical History:  Past Medical History:  Diagnosis Date  . Alcohol abuse   . Pancreatitis    History reviewed. No pertinent surgical history. Family History:  Family History  Problem Relation Age of Onset  . Alcohol abuse Mother   . Diabetes Mother   . Alcohol abuse Father   . Hypertension Father   . CAD Other   . Diabetes Other   . Hypertension Other   . Stroke Other    Family Psychiatric  History: See H&P Social History:  Social History   Substance and Sexual Activity  Alcohol Use Yes   Comment: 1 bottle vodka per day     Social History   Substance and Sexual Activity  Drug Use Yes  . Types: Marijuana    Social History   Socioeconomic History  . Marital status: Single    Spouse name: Not on file  . Number of children: Not on file  . Years of education: Not on file  . Highest education level: Not on file  Occupational History  . Not on file   Social Needs  . Financial resource strain: Not on file  . Food insecurity:    Worry: Not on file    Inability: Not on file  . Transportation needs:    Medical: Not on file    Non-medical: Not on file  Tobacco Use  . Smoking status: Current Some Day Smoker    Packs/day: 0.15    Types: Cigarettes  . Smokeless tobacco: Never Used  Substance and Sexual Activity  . Alcohol use: Yes    Comment: 1 bottle vodka per day  . Drug use: Yes    Types: Marijuana  . Sexual activity: Yes  Lifestyle  . Physical activity:    Days per week: Not on file    Minutes per session: Not on file  . Stress: Not on file  Relationships  . Social connections:    Talks on phone: Not on file    Gets together: Not on file    Attends religious service: Not on file    Active member of club or organization: Not on file    Attends meetings of clubs or organizations: Not on file    Relationship status: Not on file  Other Topics Concern  . Not on file  Social History Narrative  . Not on file   Hospital Course: (Per Md's SRA): Steven Mckenzie is a 22 y/o M with history of alcohol use disorder and treatment for depression who was admitted from ED after he initially presented with epigastric pain  in the context of relapse of use of alcohol. Pt had been attending [facility] for [facility] use treatment, and he reports he relapsed with use of alcohol after 2 weeks of sobriety. He reported drinking at least "ten shots" on St. Patrick's Day with friends and then he had onset of epigastric pain which pt associated with previous diagnoses of pancreatitis and pancreatic cyst. Pt was concerned about symptoms of alcohol withdrawal and he was transferred to [facility] for additional treatment. Pt had ongoing nausea and epigastric pain, and he was sent back to ED for evaluation. He was evaluated to likely have gastritis associated with alcohol use and he was returned to [facility] for additional treatment.  Upon evaluation, pt shares,  "I came to [facility] ED first because I have pancreatitis and I was having stomach pain. I got out but when I was waiting for my medications I started to shake from the pain, and I didn't want to have a seizure, so I came back." Pt denies symptoms of depression, mania, OCD, and PTSD. He denies sleep disturbance. His appetite is good. He shares that he only used alcohol once after maintaining sobriety for the past 2 weeks, and he has been in the [facility] program for the past 10 weeks. He denies history of mental illness. He denies illicit substance use aside from alcohol.  Discussed with patient about treatment options. He agrees to be monitored on the alcohol withdrawal protocol, and he would like to return to [facility] once he has detoxed. He denies depression and anxiety, but he has some mild difficulty with getting to sleep and he would like to try something different than trazodone, so he agrees to trial of low dose of remeron. He had no further questions, comments, or concerns.   Physical Findings: AIMS: Facial and Oral Movements Muscles of Facial Expression: None, normal Lips and Perioral Area: None, normal Jaw: None, normal Tongue: None, normal,Extremity Movements Upper (arms, wrists, hands, fingers): None, normal Lower (legs, knees, ankles, toes): None, normal, Trunk Movements Neck, shoulders, hips: None, normal, Overall Severity Severity of abnormal movements (highest score from questions above): None, normal Incapacitation due to abnormal movements: None, normal Patient's awareness of abnormal movements (rate only patient's report): No Awareness, Dental Status Current problems with teeth and/or dentures?: No Does patient usually wear dentures?: No  CIWA:  CIWA-Ar Total: 0 COWS:     Musculoskeletal: Strength & Muscle Tone: within normal limits Gait & Station: normal Patient leans: N/A  Psychiatric Specialty Exam: Physical Exam  Constitutional: He appears  well-developed.  HENT:  Head: Normocephalic.  Eyes: Pupils are equal, round, and reactive to light.  Neck: Normal range of motion.  Cardiovascular: Normal rate.  Respiratory: Effort normal.  Genitourinary:  Genitourinary Comments: Deferred  Musculoskeletal: Normal range of motion.  Neurological: He is alert.  Skin: Skin is warm.    Review of Systems  Constitutional: Negative.   HENT: Negative.   Eyes: Negative.   Respiratory: Negative.   Cardiovascular: Negative.   Gastrointestinal: Negative.   Genitourinary: Negative.   Skin: Negative.   Neurological: Negative.   Endo/Heme/Allergies: Negative.   Psychiatric/Behavioral: Positive for depression (Stable) and substance abuse (Hx. Alcoholism, (Stable)). Negative for hallucinations, memory loss and suicidal ideas. The patient is not nervous/anxious and does not have insomnia.     Blood pressure 108/77, pulse 100, temperature (!) 97.5 F (36.4 C), temperature source Oral, resp. rate 18, height 5\' 10"  (1.778 m), weight 68.5 kg (151 lb), SpO2  100 %.Body mass index is 21.67 kg/m.  See Md's SRA   Have you used any form of tobacco in the last 30 days? (Cigarettes, Smokeless Tobacco, Cigars, and/or Pipes): No  Has this patient used any form of tobacco in the last 30 days? (Cigarettes, Smokeless Tobacco, Cigars, and/or Pipes) Yes, N/A  Blood Alcohol level:  Lab Results  Component Value Date   ETH <10 03/13/2018   ETH <10 09/30/2017    Metabolic Disorder Labs:  No results found for: HGBA1C, MPG No results found for: PROLACTIN Lab Results  Component Value Date   TRIG 50 05/17/2017    See Psychiatric Specialty Exam and Suicide Risk Assessment completed by Attending Physician prior to discharge.  Discharge destination:  Home  Is patient on multiple antipsychotic therapies at discharge:  No   Has Patient had three or more failed trials of antipsychotic monotherapy by history:  No  Recommended Plan for Multiple Antipsychotic  Therapies: NA   Allergies as of 03/16/2018      Reactions   Banana Hives   Celery Oil Hives   Celery    Percocet [oxycodone-acetaminophen] Itching   Pineapple Hives      Medication List    STOP taking these medications   chlordiazePOXIDE 25 MG capsule Commonly known as:  LIBRIUM   folic acid 1 MG tablet Commonly known as:  FOLVITE   lipase/protease/amylase 16109 units Cpep capsule Commonly known as:  CREON   pantoprazole 40 MG tablet Commonly known as:  PROTONIX   pregabalin 25 MG capsule Commonly known as:  LYRICA   promethazine 25 MG tablet Commonly known as:  PHENERGAN   thiamine 100 MG tablet     TAKE these medications     Indication  famotidine 40 MG tablet Commonly known as:  PEPCID Take 1 tablet (40 mg total) by mouth daily. For acid reflux  Indication:  Gastroesophageal Reflux Disease   hydrOXYzine 25 MG tablet Commonly known as:  ATARAX/VISTARIL Take 1 tablet (25 mg) by mouth three times daily  Indication:  Feeling Anxious   mirtazapine 7.5 MG tablet Commonly known as:  REMERON Take 1 tablet (7.5 mg total) by mouth at bedtime. For sleep  Indication:  Insomnia   propranolol 10 MG tablet Commonly known as:  INDERAL Take 0.5 tablets (5 mg total) by mouth 2 (two) times daily. For anxiety What changed:  additional instructions  Indication:  Anxiety      Follow-up Information    [facility] Follow up.   Why:  Please resume your SAIOP schedule immediately at discharge. Thank you.  Contact information: [address] [place] [address] [phone]        [facility] Follow up on 05/17/2018.   Specialty:  Behavioral Health Why:  Medication management appt on Wed, 5/22 at 10:00AM with Dr. Lolly Mustache. Please bring: photo ID and insurance card to this appt. Thank you.  Contact information: [address] [place] [address] [phone]         Follow-up  recommendations: Activity:  As tolerated Diet: As recommended by your primary care doctor. Keep all scheduled follow-up appointments as recommended.  Comments: Patient is instructed prior to discharge to: Take all medications as prescribed by his/her mental healthcare provider. Report any adverse effects and or reactions from the medicines to his/her outpatient provider promptly. Patient has been instructed & cautioned: To not engage in alcohol and or illegal drug use while on prescription medicines.  In the event of worsening symptoms, patient is instructed to call the crisis hotline, 911 and or go to the nearest ED for appropriate evaluation and treatment of symptoms. To follow-up with his/her primary care provider for your other medical issues, concerns and or health care needs.   Signed: Armandina StammerAgnes Nwoko, NP, pmhnp, fnp-bc 03/16/2018, 11:55 AM   Patient seen, Suicide Assessment Completed.  Disposition Plan Reviewed

## 2018-03-16 NOTE — [facility] Suicide Risk Assessment (Signed)
[facility]   Principal Problem: Alcohol use disorder, severe, dependence (HCC) Discharge Diagnoses:  Patient Active Problem List   Diagnosis Date Noted  . Alcohol use disorder, severe, dependence (HCC) [F10.20] 03/15/2018  . MDD (major depressive disorder) [F32.9] 03/14/2018  . Normocytic anemia [D64.9] 10/01/2017  . Hypomagnesemia [E83.42] 10/01/2017  . 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 Alcohol withdrawal delirium, acute, hyperactive (HCC) [F10.231] 09/30/2017  . Alcohol abuse [F10.10] 09/30/2017  . Alcohol withdrawal (HCC) [F10.239] 09/30/2017  . Hypokalemia [E87.6] 05/17/2017    Total Time spent with patient: 30 minutes  Musculoskeletal: Strength & Muscle Tone: within normal limits Gait & Station: normal Patient leans: N/A  Psychiatric Specialty Exam: Review of Systems  Constitutional: Negative for chills and fever.  Respiratory: Negative for cough and shortness of breath.   Cardiovascular: Negative for chest pain.  Gastrointestinal: Negative for abdominal pain, heartburn, nausea and vomiting.  Psychiatric/Behavioral: Negative for depression, hallucinations and suicidal ideas. The patient is not nervous/anxious.     Blood pressure 108/77, pulse 100, temperature (!) 97.5 F (36.4 C), temperature source Oral, resp. rate 18, height 5\' 10"  (1.778 m), weight 68.5 kg (151 lb), SpO2 100 %.Body mass index is 21.67 kg/m.  General Appearance: Casual and Fairly Groomed  Patent attorneyye Contact::  Good  Speech:  Clear and Coherent and Normal Rate  Volume:  Normal  Mood:  Euthymic  Affect:  Appropriate, Congruent and Full Range  Thought Process:  Coherent and Goal Directed  Orientation:  Full (Time, Place, and Person)  Thought Content:  Logical  Suicidal Thoughts:  No  Homicidal Thoughts:  No  Memory:  Immediate;   Fair Recent;   Fair Remote;   Fair  Judgement:  Fair  Insight:  Fair  Psychomotor Activity:  Normal  Concentration:  Good  Recall:   Good  Fund of Knowledge:Fair  Language: Fair  Akathisia:  No  Handed:    AIMS (if indicated):     Assets:  Communication Skills Resilience  Sleep:  Number of Hours: 6.75  Cognition: WNL  ADL's:  Intact   Mental Status Per Nursing Assessment::   On Admission:  NA  Demographic Factors:  Low socioeconomic status  Loss Factors: Financial problems/change in socioeconomic status  Historical Factors: Impulsivity  Risk Reduction Factors:   Positive social support, Positive therapeutic relationship and Positive coping skills or problem solving skills  Continued Clinical Symptoms:  Depression:   Comorbid alcohol abuse/dependence Alcohol/Substance Abuse/Dependencies  Cognitive Features That Contribute To Risk:  None    Suicide Risk:  Minimal: No identifiable suicidal ideation.  Patients presenting with no risk factors but with morbid ruminations; may be classified as minimal risk based on the severity of the depressive symptoms  Follow-up Information    Schedule an appointment as soon as possible for a visit with [facility].   Why:  As needed Contact information: [address] [place] [id number] [phone]       [facility] Follow up.   Why:  Please resume your SAIOP schedule immediately at discharge. Thank you.  Contact information: [address] [place] [id number] [phone]         Subjective Data:  Steven Mckenzie is a 22 y/o M with history of alcohol use disorder and treatment for depression who was admitted from ED after he initially presented with epigastric pain in the context of relapse of use of alcohol. Pt had been attending [facility] for alcohol use treatment, and he reports he relapsed with use of alcohol after 2 weeks of sobriety. He reported drinking at least "ten shots" on St. Patrick's Day with friends and then he had onset of epigastric pain which pt associated  with previous diagnoses of pancreatitis and pancreatic cyst. Pt was concerned about symptoms of alcohol withdrawal and he was transferred to [facility] for additional treatment. Pt had ongoing nausea and epigastric pain, and he was sent back to ED for evaluation. He was evaluated to likely have gastritis associated with alcohol use and he was returned to [facility] for additional treatment. Pt was started on CIWA protocol with ativan as well as remeron to help with mood, anxiety, and sleep. He reported stability of both his mood symptoms and physical pain associated with presenting symptoms.  Today upon evaluation, pt shares, "I'm good." He denies any specific concerns. He has no physical complaints. He denies SI/HI/AH/VH. His appetite is good. His sleep is good. He is tolerating his medications well. He agrees to continue his current regimen without changes after discharge. He plans to return to [facility] for ongoing alcohol use treatment. He was able to engage in safety planning including plan to return to [facility] or contact emergency services if he feels unable to maintain his own safety or the safety of others. Pt had no further questions, comments, or concerns.   Plan Of Care/Follow-up recommendations:   -Discharge to outpatient level of care  -Insomnia/MDD            - Continue remeron 7.5mg  po qhs  -Anxiety             - Continue atarax 25mg  po q6h prn anxiety             - Continue propranolol 5mg  po BID  -gastric pain             - Continue pepcid 40mg  po qDay    Activity:  as tolerated Diet:  normal Tests:  NA Other:  see above for DC plan  Steven Likens, MD 03/16/2018, 9:46 AM

## 2018-03-16 NOTE — Progress Notes (Signed)
Pt discharged home with the girl friend. Pt was ambulatory, stable and appreciative at that time. All papers and prescriptions were given and valuables returned. Verbal understanding expressed. Denies SI/HI and A/VH. Pt given opportunity to express concerns and ask questions.  

## 2018-04-17 ENCOUNTER — Other Ambulatory Visit: Payer: Self-pay

## 2018-04-17 ENCOUNTER — Emergency Department (HOSPITAL_COMMUNITY)
Admission: EM | Admit: 2018-04-17 | Discharge: 2018-04-18 | Disposition: A | Discharge status: Home / Self Care | Payer: BLUE CROSS/BLUE SHIELD | Attending: Emergency Medicine | Admitting: Emergency Medicine

## 2018-04-17 ENCOUNTER — Encounter (HOSPITAL_COMMUNITY): Payer: Self-pay | Admitting: Emergency Medicine

## 2018-04-17 DIAGNOSIS — F17210 Nicotine dependence, cigarettes, uncomplicated: Secondary | ICD-10-CM | POA: Diagnosis not present

## 2018-04-17 DIAGNOSIS — R1013 Epigastric pain: Secondary | ICD-10-CM | POA: Diagnosis present

## 2018-04-17 DIAGNOSIS — R109 Unspecified abdominal pain: Secondary | ICD-10-CM

## 2018-04-17 DIAGNOSIS — K852 Alcohol induced acute pancreatitis without necrosis or infection: Secondary | ICD-10-CM | POA: Diagnosis not present

## 2018-04-17 LAB — COMPREHENSIVE METABOLIC PANEL
ALK PHOS: 65 U/L (ref 38–126)
ALT: 24 U/L (ref 17–63)
ANION GAP: 10 (ref 5–15)
AST: 23 U/L (ref 15–41)
Albumin: 4.3 g/dL (ref 3.5–5.0)
BILIRUBIN TOTAL: 0.9 mg/dL (ref 0.3–1.2)
BUN: 13 mg/dL (ref 6–20)
CALCIUM: 9.5 mg/dL (ref 8.9–10.3)
CO2: 23 mmol/L (ref 22–32)
Chloride: 104 mmol/L (ref 101–111)
Creatinine, Ser: 1.07 mg/dL (ref 0.61–1.24)
GFR calc Af Amer: >60 mL/min (ref >60)
GLUCOSE: 97 mg/dL (ref 65–99)
POTASSIUM: 3.9 mmol/L (ref 3.5–5.1)
Sodium: 137 mmol/L (ref 135–145)
TOTAL PROTEIN: 6.9 g/dL (ref 6.5–8.1)

## 2018-04-17 LAB — CBC
HEMATOCRIT: 41.8 % (ref 39.0–52.0)
HEMOGLOBIN: 14.7 g/dL (ref 13.0–17.0)
MCH: 33.8 pg (ref 26.0–34.0)
MCHC: 35.2 g/dL (ref 30.0–36.0)
MCV: 96.1 fL (ref 78.0–100.0)
Platelets: 262 10*3/uL (ref 150–400)
RBC: 4.35 MIL/uL (ref 4.22–5.81)
RDW: 13 % (ref 11.5–15.5)
WBC: 9.4 10*3/uL (ref 4.0–10.5)

## 2018-04-17 LAB — URINALYSIS, ROUTINE W REFLEX MICROSCOPIC
Bilirubin Urine: NEGATIVE
Glucose, UA: NEGATIVE mg/dL
Hgb urine dipstick: NEGATIVE
Ketones, ur: 20 mg/dL — AB
LEUKOCYTES UA: NEGATIVE
NITRITE: NEGATIVE
Protein, ur: NEGATIVE mg/dL
Specific Gravity, Urine: 1.021 (ref 1.005–1.030)
pH: 6 (ref 5.0–8.0)

## 2018-04-17 LAB — LIPASE, BLOOD: Lipase: 66 U/L — ABNORMAL HIGH (ref 11–51)

## 2018-04-17 MED ORDER — OXYCODONE-ACETAMINOPHEN 5-325 MG PO TABS
1.0000 | ORAL_TABLET | ORAL | Status: DC | PRN
  Administered 2018-04-17: 1 via ORAL
  Filled 2018-04-17: qty 1

## 2018-04-17 MED ORDER — LACTATED RINGERS IV BOLUS
1500.0000 mL | Freq: Once | INTRAVENOUS | Status: AC
  Administered 2018-04-17: 1500 mL via INTRAVENOUS

## 2018-04-17 MED ORDER — ONDANSETRON 4 MG PO TBDP
4.0000 mg | ORAL_TABLET | Freq: Once | ORAL | Status: AC | PRN
  Administered 2018-04-17: 4 mg via ORAL
  Filled 2018-04-17: qty 1

## 2018-04-17 MED ORDER — FAMOTIDINE IN NACL 20-0.9 MG/50ML-% IV SOLN
20.0000 mg | INTRAVENOUS | Status: AC
  Administered 2018-04-17: 20 mg via INTRAVENOUS
  Filled 2018-04-17: qty 50

## 2018-04-17 MED ORDER — HYDROMORPHONE HCL 2 MG/ML IJ SOLN
1.0000 mg | Freq: Once | INTRAMUSCULAR | Status: AC
  Administered 2018-04-17: 1 mg via INTRAVENOUS
  Filled 2018-04-17: qty 1

## 2018-04-17 MED ORDER — KETOROLAC TROMETHAMINE 30 MG/ML IJ SOLN
30.0000 mg | Freq: Once | INTRAMUSCULAR | Status: AC
  Administered 2018-04-17: 30 mg via INTRAVENOUS
  Filled 2018-04-17: qty 1

## 2018-04-17 NOTE — ED Provider Notes (Signed)
[facility] EMERGENCY DEPARTMENT Provider Note   CSN: [id number] Arrival date & time: 04/17/18  1652     History   Chief Complaint Chief Complaint  Patient presents with  . Abdominal Pain    HPI Steven Mckenzie is a 22 y.o. male.  22 year old male with a history of recurrent pancreatitis presents to the emergency department for abdominal pain which began 3 days ago.  He reports last consumption of alcohol 4 days ago.  He states that he drank much less than usual.  He is complaining of a constant, sharp pain in his upper and lower abdomen as well as his back.  He has had nausea as well as vomiting.  Last episode of emesis was prior to arrival.  Vomiting has been present for a total of 24 hours.  Symptoms feel consistent with past pancreatitis flares.  He has not had any associated fevers, but reports sweats yesterday evening.  No urinary symptoms, bowel complaints.  He cannot recall the date of his last bowel movement and has not passed any flatus in the past 2 days.  No history of abdominal surgeries.     Past Medical History:  Diagnosis Date  . Alcohol abuse   . Pancreatitis     Patient Active Problem List   Diagnosis Date Noted  . Alcohol use disorder, severe, dependence (HCC) 03/15/2018  . MDD (major depressive disorder) 03/14/2018  . Normocytic anemia 10/01/2017  . Hypomagnesemia 10/01/2017  . 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 Alcohol withdrawal delirium, acute, hyperactive (HCC) 09/30/2017  . Alcohol abuse 09/30/2017  . Alcohol withdrawal (HCC) 09/30/2017  . Hypokalemia 05/17/2017    History reviewed. No pertinent surgical history.      Home Medications    Prior to Admission medications   Medication Sig Start Date End Date Taking? Authorizing Provider  famotidine (PEPCID) 40 MG tablet Take 1 tablet (40 mg total) by mouth daily. For acid reflux 04/18/18   Antony Madura, PA-C  HYDROcodone-acetaminophen (NORCO/VICODIN) 5-325 MG tablet Take 1-2  tablets by mouth every 6 (six) hours as needed. 04/18/18   Antony Madura, PA-C  hydrOXYzine (ATARAX/VISTARIL) 25 MG tablet Take 1 tablet (25 mg) by mouth three times daily Patient not taking: Reported on 04/18/2018 03/16/18   Armandina Stammer I, NP  mirtazapine (REMERON) 7.5 MG tablet Take 1 tablet (7.5 mg total) by mouth at bedtime. For sleep Patient not taking: Reported on 04/18/2018 03/16/18   Armandina Stammer I, NP  promethazine (PHENERGAN) 25 MG tablet Take 1 tablet (25 mg total) by mouth every 6 (six) hours as needed for nausea or vomiting. 04/18/18   Antony Madura, PA-C  propranolol (INDERAL) 10 MG tablet Take 0.5 tablets (5 mg total) by mouth 2 (two) times daily. For anxiety Patient not taking: Reported on 04/18/2018 03/16/18   Sanjuana Kava, NP    Family History Family History  Problem Relation Age of Onset  . Alcohol abuse Mother   . Diabetes Mother   . Alcohol abuse Father   . Hypertension Father   . CAD Other   . Diabetes Other   . Hypertension Other   . Stroke Other     Social History Social History   Tobacco Use  . Smoking status: Current Some Day Smoker    Packs/day: 0.15    Types: Cigarettes  . Smokeless tobacco: Never Used  Substance Use Topics  . Alcohol use: Yes    Comment: 1 bottle vodka per day  . Drug use: Yes  Types: Marijuana     Allergies   Banana; Celery oil; and Pineapple   Review of Systems Review of Systems Ten systems reviewed and are negative for acute change, except as noted in the HPI.    Physical Exam Updated Vital Signs BP 109/66 (BP Location: Right Arm)   Pulse 80   Temp 98.1 F (36.7 C) (Oral)   Resp 16   SpO2 98%   Physical Exam  Constitutional: He is oriented to person, place, and time. He appears well-developed and well-nourished. No distress.  Nontoxic appearing and in NAD  HENT:  Head: Normocephalic and atraumatic.  Eyes: Conjunctivae and EOM are normal. No scleral icterus.  Neck: Normal range of motion.  Cardiovascular:  Normal rate, regular rhythm and intact distal pulses.  Pulmonary/Chest: Effort normal. No stridor. No respiratory distress. He has no wheezes.  Lungs CTAB. Respirations even and unlabored.  Abdominal: Soft. He exhibits no mass. There is tenderness.  Generalized tenderness to palpation, worse in the epigastrium.  There is mild voluntary guarding.  No peritoneal signs or palpable masses.  Musculoskeletal: Normal range of motion.  Neurological: He is alert and oriented to person, place, and time. He exhibits normal muscle tone. Coordination normal.  Ambulatory with steady gait  Skin: Skin is warm and dry. No rash noted. He is not diaphoretic. No erythema. No pallor.  Psychiatric: He has a normal mood and affect. His behavior is normal.  Nursing note and vitals reviewed.    ED Treatments / Results  Labs (all labs ordered are listed, but only abnormal results are displayed) Labs Reviewed  LIPASE, BLOOD - Abnormal; Notable for the following components:      Result Value   Lipase 66 (*)    All other components within normal limits  URINALYSIS, ROUTINE W REFLEX MICROSCOPIC - Abnormal; Notable for the following components:   Ketones, ur 20 (*)    All other components within normal limits  COMPREHENSIVE METABOLIC PANEL  CBC    EKG None  Radiology US Abdomen Complete  Result Date: 04/18/2018 CLINICAL DATA:  Abdominal pain EXAM: ABDOMEN ULTRASOUND COMPLETE COMPARISON:  None. FINDINGS: Gallbladder: No gallstones or wall thickening visualized. No sonographic Murphy sign noted by sonographer. Common bile duct: Diameter: 2.7 mm Liver: No focal lesion identified. Within normal limits in parenchymal echogenicity. Portal vein is patent on color Doppler imaging with normal direction of blood flow towards the liver. IVC: No abnormality visualized. Pancreas: Visualized portion unremarkable. Spleen: Size and appearance within normal limits. Adjacent accessory spleen. Right Kidney: Length: 10.6 cm.  Echogenicity within normal limits. No mass or hydronephrosis visualized. Left Kidney: Length: 10 cm. Echogenicity within normal limits. No mass or hydronephrosis visualized. Abdominal aorta: No aneurysm visualized. Other findings: None. IMPRESSION: Unremarkable abdominal ultrasound exam. No sonographic findings for the patient's abdominal pain. Electronically Signed   By: Tollie Eth M.D.   On: 04/18/2018 02:33   Dg Abd 2 Views  Result Date: 04/18/2018 CLINICAL DATA:  Left-sided abdominal pain and constipation. EXAM: ABDOMEN - 2 VIEW COMPARISON:  03/14/2018 FINDINGS: The bowel gas pattern is normal. There is no evidence of free air. No radio-opaque calculi or other significant radiographic abnormality is seen. IMPRESSION: Negative. Electronically Signed   By: Deatra Robinson M.D.   On: 04/18/2018 00:31    Procedures Procedures (including critical care time)  Medications Ordered in ED Medications  ondansetron (ZOFRAN-ODT) disintegrating tablet 4 mg (4 mg Oral Given 04/17/18 1820)  lactated ringers bolus 1,500 mL (0 mLs Intravenous Stopped 04/18/18  0111)  famotidine (PEPCID) IVPB 20 mg premix (0 mg Intravenous Stopped 04/18/18 0008)  ketorolac (TORADOL) 30 MG/ML injection 30 mg (30 mg Intravenous Given 04/17/18 2336)  HYDROmorphone (DILAUDID) injection 1 mg (1 mg Intravenous Given 04/17/18 2337)  dicyclomine (BENTYL) injection 20 mg (20 mg Intramuscular Given 04/18/18 0108)  oxyCODONE-acetaminophen (PERCOCET/ROXICET) 5-325 MG per tablet 2 tablet (2 tablets Oral Given 04/18/18 0229)  lactated ringers bolus 500 mL (0 mLs Intravenous Stopped 04/18/18 0300)  diphenhydrAMINE (BENADRYL) capsule 25 mg (25 mg Oral Given 04/18/18 0245)    1:00 AM I had a discussion with the patient about his ongoing symptoms.  He expresses concern that something else may be causing his symptoms other than consistent alcohol use.  He reports history of a gallstone and notes desire for additional imaging.  I do not believe it is  unreasonable to complete an ultrasound, but I believe a CT scan would only increase risk of radiation exposure in the light of a reassuring laboratory evaluation.  Plan discussed with patient to proceed with ultrasound.  Patient verbalizes comfort and understanding with plan.  Will add Bentyl for additional pain control, though pain has improved since receiving IV medications.   Initial Impression / Assessment and Plan / ED Course  I have reviewed the triage vital signs and the nursing notes.  Pertinent labs & imaging results that were available during my care of the patient were reviewed by me and considered in my medical decision making (see chart for details).     22 year old male presents to the emergency department for epigastric and left upper quadrant pain in the setting of recent alcohol use.  History of alcohol induced acute pancreatitis.  Prior admission in October with imaging showing likely pseudocyst of the pancreatic tail.    Laboratory workup today is reassuring with focal abdominal tenderness in the epigastrium associated also with generalized abdominal tenderness.  No peritoneal signs.  Labs reassuring with only mild elevation to lipase.  No leukocytosis or elevated LFTs.  US of the abdomen and abdominal Xray are both negative for acute or emergent process.  Symptoms have been managed supportively and patient has been adequately hydrated while in the emergency department.   Ranson score is less than or equal to 1 and patient reporting symptomatic improvement on repeat assessment; severe pancreatitis unlikely.  I believe it is appropriate for him to continue to be managed on an outpatient basis at this time.  Will prescribe Phenergan for nausea and Pepcid for daily use.  The patient has been advised to discontinue use of alcohol into hydrate with clear liquids throughout the day.  He is to follow up with gastroenterology.  Return precautions discussed and provided. Patient discharged  in stable condition with no unaddressed concerns.  Vitals:   04/17/18 2330 04/18/18 0010 04/18/18 0049 04/18/18 0222  BP: 118/76 134/80 133/84 109/66  Pulse: 89 67 61 80  Resp: 16 18 16 16   Temp:      TempSrc:      SpO2: 100% 100% 99% 98%    Final Clinical Impressions(s) / ED Diagnoses   Final diagnoses:  Abdominal pain  Alcohol-induced acute pancreatitis, unspecified complication status    ED Discharge Orders        Ordered    HYDROcodone-acetaminophen (NORCO/VICODIN) 5-325 MG tablet  Every 6 hours PRN     04/18/18 0307    famotidine (PEPCID) 40 MG tablet  Daily     04/18/18 0308    promethazine (PHENERGAN) 25 MG tablet  Every 6 hours PRN     04/18/18 0308       Antony MaduraHumes, Pernell Dikes, PA-C 04/18/18 [id number]    Devoria AlbeKnapp, Iva, MD 04/18/18 [phone]

## 2018-04-17 NOTE — ED Notes (Signed)
Pt requesting pain medication, states he doesn't have an allergy to percocet, it just makes him itch.

## 2018-04-17 NOTE — ED Triage Notes (Signed)
Pt c/o abdominal pain with nausea/vomiting x 1 day. Hx pancreatitis.

## 2018-04-17 NOTE — ED Notes (Signed)
Patient transported to X-ray 

## 2018-04-18 ENCOUNTER — Emergency Department (HOSPITAL_COMMUNITY): Payer: BLUE CROSS/BLUE SHIELD

## 2018-04-18 MED ORDER — FAMOTIDINE 40 MG PO TABS: 40.0000 mg | ORAL_TABLET | Freq: Every day | ORAL | 1 refills | Status: AC

## 2018-04-18 MED ORDER — DICYCLOMINE HCL 10 MG/ML IM SOLN
20.0000 mg | Freq: Once | INTRAMUSCULAR | Status: AC
  Administered 2018-04-18: 20 mg via INTRAMUSCULAR
  Filled 2018-04-18: qty 2

## 2018-04-18 MED ORDER — HYDROCODONE-ACETAMINOPHEN 5-325 MG PO TABS: 1.0000 | ORAL_TABLET | Freq: Four times a day (QID) | ORAL | 0 refills | Status: AC | PRN

## 2018-04-18 MED ORDER — LACTATED RINGERS IV BOLUS
500.0000 mL | Freq: Once | INTRAVENOUS | Status: AC
  Administered 2018-04-18: 500 mL via INTRAVENOUS

## 2018-04-18 MED ORDER — PROMETHAZINE HCL 25 MG PO TABS: 25.0000 mg | ORAL_TABLET | Freq: Four times a day (QID) | ORAL | 0 refills | Status: AC | PRN

## 2018-04-18 MED ORDER — OXYCODONE-ACETAMINOPHEN 5-325 MG PO TABS
2.0000 | ORAL_TABLET | Freq: Once | ORAL | Status: AC
  Administered 2018-04-18: 2 via ORAL
  Filled 2018-04-18: qty 2

## 2018-04-18 MED ORDER — DIPHENHYDRAMINE HCL 25 MG PO CAPS
25.0000 mg | ORAL_CAPSULE | Freq: Once | ORAL | Status: AC
  Administered 2018-04-18: 25 mg via ORAL
  Filled 2018-04-18: qty 1

## 2018-04-18 NOTE — Discharge Instructions (Addendum)
Take 40mg  Pepcid (Famotidine) daily as previously prescribed to you. Drink plenty of clear liquids to prevent dehydration. You may take Norco as needed for severe pain. You have been prescribed Phenergan to use for nausea/vomiting. Follow up with your gastroenterologist. AVOID ALL USE OF ALCOHOL.

## 2018-04-18 NOTE — ED Notes (Signed)
Patient currently at ultrasound .  

## 2018-04-18 NOTE — ED Notes (Signed)
Patient transported to Ultrasound 

## 2018-04-18 NOTE — ED Notes (Signed)
Patient verbalizes understanding of discharge instructions. Opportunity for questioning and answers were provided. Armband removed by staff, pt discharged from ED.  

## 2018-04-18 NOTE — ED Notes (Signed)
PA explained tests results and plan of care to pt. 

## 2018-04-22 ENCOUNTER — Other Ambulatory Visit: Payer: Self-pay

## 2018-04-22 ENCOUNTER — Encounter (HOSPITAL_COMMUNITY): Payer: Self-pay

## 2018-04-22 DIAGNOSIS — Z79899 Other long term (current) drug therapy: Secondary | ICD-10-CM | POA: Diagnosis not present

## 2018-04-22 DIAGNOSIS — F1010 Alcohol abuse, uncomplicated: Secondary | ICD-10-CM | POA: Diagnosis not present

## 2018-04-22 DIAGNOSIS — R1013 Epigastric pain: Secondary | ICD-10-CM | POA: Insufficient documentation

## 2018-04-22 DIAGNOSIS — F1210 Cannabis abuse, uncomplicated: Secondary | ICD-10-CM | POA: Diagnosis not present

## 2018-04-22 DIAGNOSIS — F17210 Nicotine dependence, cigarettes, uncomplicated: Secondary | ICD-10-CM | POA: Diagnosis not present

## 2018-04-22 NOTE — ED Triage Notes (Signed)
Pt reports upper abdominal pain since this morning. He has a history of pancreatitis and state it feels similar. He reports that he has been nauseous all day. A&Ox4. Ambulatory.

## 2018-04-23 ENCOUNTER — Emergency Department (HOSPITAL_COMMUNITY)
Admission: EM | Admit: 2018-04-23 | Discharge: 2018-04-23 | Disposition: A | Discharge status: Home / Self Care | Payer: BLUE CROSS/BLUE SHIELD | Attending: Emergency Medicine | Admitting: Emergency Medicine

## 2018-04-23 DIAGNOSIS — F1010 Alcohol abuse, uncomplicated: Secondary | ICD-10-CM

## 2018-04-23 DIAGNOSIS — F1210 Cannabis abuse, uncomplicated: Secondary | ICD-10-CM

## 2018-04-23 DIAGNOSIS — R1013 Epigastric pain: Secondary | ICD-10-CM

## 2018-04-23 DIAGNOSIS — R110 Nausea: Secondary | ICD-10-CM

## 2018-04-23 DIAGNOSIS — R112 Nausea with vomiting, unspecified: Secondary | ICD-10-CM

## 2018-04-23 LAB — CBC
HEMATOCRIT: 41.8 % (ref 39.0–52.0)
Hemoglobin: 14.1 g/dL (ref 13.0–17.0)
MCH: 32.6 pg (ref 26.0–34.0)
MCHC: 33.7 g/dL (ref 30.0–36.0)
MCV: 96.8 fL (ref 78.0–100.0)
Platelets: 338 10*3/uL (ref 150–400)
RBC: 4.32 MIL/uL (ref 4.22–5.81)
RDW: 13.3 % (ref 11.5–15.5)
WBC: 6.1 10*3/uL (ref 4.0–10.5)

## 2018-04-23 LAB — COMPREHENSIVE METABOLIC PANEL
ALT: 13 U/L — AB (ref 17–63)
AST: 27 U/L (ref 15–41)
Albumin: 4.6 g/dL (ref 3.5–5.0)
Alkaline Phosphatase: 59 U/L (ref 38–126)
Anion gap: 13 (ref 5–15)
BUN: 12 mg/dL (ref 6–20)
CHLORIDE: 106 mmol/L (ref 101–111)
CO2: 23 mmol/L (ref 22–32)
Calcium: 9.9 mg/dL (ref 8.9–10.3)
Creatinine, Ser: 0.95 mg/dL (ref 0.61–1.24)
GFR calc Af Amer: >60 mL/min (ref >60)
Glucose, Bld: 99 mg/dL (ref 65–99)
POTASSIUM: 3.8 mmol/L (ref 3.5–5.1)
SODIUM: 142 mmol/L (ref 135–145)
Total Bilirubin: 0.7 mg/dL (ref 0.3–1.2)
Total Protein: 7.9 g/dL (ref 6.5–8.1)

## 2018-04-23 LAB — RAPID URINE DRUG SCREEN, HOSP PERFORMED
AMPHETAMINES: NOT DETECTED
BARBITURATES: NOT DETECTED
BENZODIAZEPINES: NOT DETECTED
Cocaine: NOT DETECTED
Opiates: NOT DETECTED
TETRAHYDROCANNABINOL: POSITIVE — AB

## 2018-04-23 LAB — URINALYSIS, ROUTINE W REFLEX MICROSCOPIC
Bilirubin Urine: NEGATIVE
GLUCOSE, UA: NEGATIVE mg/dL
HGB URINE DIPSTICK: NEGATIVE
Ketones, ur: 5 mg/dL — AB
LEUKOCYTES UA: NEGATIVE
Nitrite: NEGATIVE
PH: 8 (ref 5.0–8.0)
PROTEIN: NEGATIVE mg/dL
SPECIFIC GRAVITY, URINE: 1.02 (ref 1.005–1.030)

## 2018-04-23 LAB — LIPASE, BLOOD: LIPASE: 24 U/L (ref 11–51)

## 2018-04-23 MED ORDER — ONDANSETRON 8 MG PO TBDP: ORAL_TABLET | ORAL | 0 refills | Status: AC

## 2018-04-23 MED ORDER — ONDANSETRON HCL 4 MG/2ML IJ SOLN
4.0000 mg | Freq: Once | INTRAMUSCULAR | Status: AC
  Administered 2018-04-23: 4 mg via INTRAVENOUS
  Filled 2018-04-23: qty 2

## 2018-04-23 MED ORDER — MORPHINE SULFATE (PF) 4 MG/ML IV SOLN
4.0000 mg | Freq: Once | INTRAVENOUS | Status: AC
  Administered 2018-04-23: 4 mg via INTRAVENOUS
  Filled 2018-04-23: qty 1

## 2018-04-23 MED ORDER — GI COCKTAIL ~~LOC~~
30.0000 mL | Freq: Once | ORAL | Status: AC
  Administered 2018-04-23: 30 mL via ORAL
  Filled 2018-04-23: qty 30

## 2018-04-23 MED ORDER — FAMOTIDINE 20 MG PO TABS
20.0000 mg | ORAL_TABLET | Freq: Once | ORAL | Status: AC
  Administered 2018-04-23: 20 mg via ORAL
  Filled 2018-04-23: qty 1

## 2018-04-23 MED ORDER — RANITIDINE HCL 150 MG PO TABS: 150.0000 mg | ORAL_TABLET | Freq: Two times a day (BID) | ORAL | 0 refills | Status: AC

## 2018-04-23 MED ORDER — SODIUM CHLORIDE 0.9 % IV BOLUS
1000.0000 mL | Freq: Once | INTRAVENOUS | Status: AC
  Administered 2018-04-23: 1000 mL via INTRAVENOUS
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
## 2018-04-23 NOTE — ED Notes (Signed)
Pt notified that UA needed. Provided urinal. Pt states he is unable to void at the moment. Will check back later.

## 2018-04-23 NOTE — ED Notes (Signed)
Patient is drinking fluids and and is not vomiting.

## 2018-04-23 NOTE — Discharge Instructions (Addendum)
1. Medications: zantac, zofran, usual home medications 2. Treatment: rest, drink plenty of fluids, advance diet slowly 3. Follow Up: Please followup with your primary doctor in 2 days for discussion of your diagnoses and further evaluation after today's visit; if you do not have a primary care doctor use the resource guide provided to find one; Please return to the ER for persistent vomiting, high fevers or worsening symptoms

## 2018-05-07 ENCOUNTER — Emergency Department (HOSPITAL_COMMUNITY)
Admission: EM | Admit: 2018-05-07 | Discharge: 2018-05-27 | Disposition: E | Payer: Self-pay | Attending: Emergency Medicine | Admitting: Emergency Medicine

## 2018-05-07 DIAGNOSIS — Y999 Unspecified external cause status: Secondary | ICD-10-CM | POA: Insufficient documentation

## 2018-05-07 DIAGNOSIS — W3400XA Accidental discharge from unspecified firearms or gun, initial encounter: Secondary | ICD-10-CM

## 2018-05-07 DIAGNOSIS — S71132A Puncture wound without foreign body, left thigh, initial encounter: Secondary | ICD-10-CM | POA: Insufficient documentation

## 2018-05-07 DIAGNOSIS — S21339A Puncture wound without foreign body of unspecified front wall of thorax with penetration into thoracic cavity, initial encounter: Secondary | ICD-10-CM | POA: Insufficient documentation

## 2018-05-07 DIAGNOSIS — Y939 Activity, unspecified: Secondary | ICD-10-CM | POA: Insufficient documentation

## 2018-05-07 DIAGNOSIS — Y9289 Other specified places as the place of occurrence of the external cause: Secondary | ICD-10-CM | POA: Insufficient documentation

## 2018-05-07 LAB — PREPARE FRESH FROZEN PLASMA
Unit division: 0
Unit division: 0

## 2018-05-07 LAB — BPAM RBC
BLOOD PRODUCT EXPIRATION DATE: 201905222359
BLOOD PRODUCT EXPIRATION DATE: 201905222359
ISSUE DATE / TIME: 201905120226
ISSUE DATE / TIME: 201905120226
UNIT TYPE AND RH: 9500
Unit Type and Rh: 9500

## 2018-05-07 LAB — BPAM FFP
Blood Product Expiration Date: 201905252359
Blood Product Expiration Date: 201905252359
ISSUE DATE / TIME: 201905120227
ISSUE DATE / TIME: 201905120227
UNIT TYPE AND RH: 6200
Unit Type and Rh: 6200

## 2018-05-07 LAB — TYPE AND SCREEN
UNIT DIVISION: 0
Unit division: 0

## 2018-05-07 MED ORDER — KETAMINE HCL 10 MG/ML IJ SOLN
INTRAMUSCULAR | Status: AC
  Filled 2018-05-07: qty 2

## 2018-05-17 ENCOUNTER — Ambulatory Visit (HOSPITAL_COMMUNITY): Payer: Self-pay | Admitting: Psychiatry

## 2018-05-27 NOTE — ED Notes (Signed)
Patient going to morgue at this time. ?
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
## 2018-05-27 NOTE — ED Notes (Signed)
Patient apneic, pulseless and CPR in progress.  Patient is Asystole on monitor when CPR is stopped.  CPR resumed.
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
## 2018-05-27 NOTE — ED Notes (Signed)
No cardiac activity via ultrasound per Dr Corliss Skains and Dr Rhunette Croft.  TOD called at 0241.

## 2018-05-27 DEATH — deceased
